# Patient Record
Sex: Female | Born: 1970 | Race: White | Hispanic: No | Marital: Married | Smoking: Never smoker
Health system: Southern US, Community
[De-identification: ages and names within clinical notes are randomized; demographics above are authoritative.]

## PROBLEM LIST (undated history)

## (undated) DIAGNOSIS — M722 Plantar fascial fibromatosis: Secondary | ICD-10-CM

## (undated) DIAGNOSIS — J45909 Unspecified asthma, uncomplicated: Secondary | ICD-10-CM

## (undated) HISTORY — PX: NOSE SURGERY: SHX723

## (undated) HISTORY — PX: TONSILLECTOMY: SUR1361

---

## 2005-03-28 ENCOUNTER — Ambulatory Visit (HOSPITAL_COMMUNITY): Admission: RE | Admit: 2005-03-28 | Discharge: 2005-03-28 | Payer: Self-pay | Admitting: Gynecology

## 2005-05-20 ENCOUNTER — Ambulatory Visit: Payer: Self-pay | Admitting: Family Medicine

## 2005-05-30 ENCOUNTER — Ambulatory Visit: Payer: Self-pay | Admitting: Gynecology

## 2005-06-09 ENCOUNTER — Ambulatory Visit: Payer: Self-pay | Admitting: Gynecology

## 2005-06-12 ENCOUNTER — Ambulatory Visit: Payer: Self-pay | Admitting: Family Medicine

## 2005-06-17 ENCOUNTER — Encounter: Admission: RE | Admit: 2005-06-17 | Discharge: 2005-06-17 | Payer: Self-pay | Admitting: Family Medicine

## 2005-06-18 ENCOUNTER — Ambulatory Visit (HOSPITAL_COMMUNITY): Admission: RE | Admit: 2005-06-18 | Discharge: 2005-06-18 | Payer: Self-pay | Admitting: Gynecology

## 2005-06-23 ENCOUNTER — Ambulatory Visit: Payer: Self-pay | Admitting: Gynecology

## 2005-07-01 ENCOUNTER — Ambulatory Visit: Payer: Self-pay | Admitting: Family Medicine

## 2005-07-03 ENCOUNTER — Inpatient Hospital Stay (HOSPITAL_COMMUNITY): Admission: AD | Admit: 2005-07-03 | Discharge: 2005-07-03 | Payer: Self-pay | Admitting: Family Medicine

## 2005-07-03 ENCOUNTER — Ambulatory Visit: Payer: Self-pay | Admitting: Certified Nurse Midwife

## 2005-07-08 ENCOUNTER — Inpatient Hospital Stay (HOSPITAL_COMMUNITY): Admission: AD | Admit: 2005-07-08 | Discharge: 2005-07-08 | Payer: Self-pay | Admitting: Obstetrics and Gynecology

## 2005-07-10 ENCOUNTER — Ambulatory Visit: Payer: Self-pay | Admitting: Obstetrics & Gynecology

## 2005-07-14 ENCOUNTER — Ambulatory Visit: Payer: Self-pay | Admitting: Gynecology

## 2005-07-17 ENCOUNTER — Ambulatory Visit: Payer: Self-pay | Admitting: Obstetrics & Gynecology

## 2005-07-22 ENCOUNTER — Ambulatory Visit: Payer: Self-pay | Admitting: Family Medicine

## 2005-07-28 ENCOUNTER — Ambulatory Visit: Payer: Self-pay | Admitting: Gynecology

## 2005-08-01 ENCOUNTER — Ambulatory Visit: Payer: Self-pay | Admitting: Gynecology

## 2005-08-05 ENCOUNTER — Ambulatory Visit: Payer: Self-pay | Admitting: Family Medicine

## 2005-08-06 ENCOUNTER — Ambulatory Visit (HOSPITAL_COMMUNITY): Admission: RE | Admit: 2005-08-06 | Discharge: 2005-08-06 | Payer: Self-pay | Admitting: Gynecology

## 2005-08-08 ENCOUNTER — Inpatient Hospital Stay (HOSPITAL_COMMUNITY): Admission: RE | Admit: 2005-08-08 | Discharge: 2005-08-11 | Payer: Self-pay | Admitting: Obstetrics & Gynecology

## 2005-08-08 ENCOUNTER — Ambulatory Visit: Payer: Self-pay | Admitting: Obstetrics & Gynecology

## 2005-08-15 ENCOUNTER — Ambulatory Visit: Payer: Self-pay | Admitting: Gynecology

## 2005-10-09 ENCOUNTER — Ambulatory Visit: Payer: Self-pay | Admitting: Family Medicine

## 2007-01-22 ENCOUNTER — Emergency Department: Payer: Self-pay | Admitting: Emergency Medicine

## 2007-01-22 ENCOUNTER — Other Ambulatory Visit: Payer: Self-pay

## 2015-09-28 ENCOUNTER — Encounter: Payer: Self-pay | Admitting: Gynecology

## 2015-09-28 ENCOUNTER — Ambulatory Visit
Admission: EM | Admit: 2015-09-28 | Discharge: 2015-09-28 | Disposition: A | Discharge status: Home / Self Care | Payer: Medicaid Other | Attending: Family Medicine | Admitting: Family Medicine

## 2015-09-28 DIAGNOSIS — R0982 Postnasal drip: Secondary | ICD-10-CM

## 2015-09-28 DIAGNOSIS — J4520 Mild intermittent asthma, uncomplicated: Secondary | ICD-10-CM

## 2015-09-28 DIAGNOSIS — K219 Gastro-esophageal reflux disease without esophagitis: Secondary | ICD-10-CM

## 2015-09-28 DIAGNOSIS — J329 Chronic sinusitis, unspecified: Secondary | ICD-10-CM | POA: Diagnosis not present

## 2015-09-28 HISTORY — DX: Unspecified asthma, uncomplicated: J45.909

## 2015-09-28 HISTORY — DX: Plantar fascial fibromatosis: M72.2

## 2015-09-28 MED ORDER — FLUTICASONE PROPIONATE 50 MCG/ACT NA SUSP: 2.0000 | Freq: Every day | NASAL | Status: DC

## 2015-09-28 MED ORDER — ALBUTEROL SULFATE HFA 108 (90 BASE) MCG/ACT IN AERS: 1.0000 | INHALATION_SPRAY | Freq: Four times a day (QID) | RESPIRATORY_TRACT | Status: DC | PRN

## 2015-09-28 MED ORDER — ESOMEPRAZOLE MAGNESIUM 40 MG PO CPDR: 40.0000 mg | DELAYED_RELEASE_CAPSULE | Freq: Every day | ORAL | Status: DC

## 2015-09-28 NOTE — Discharge Instructions (Signed)
Asthma, Adult °Asthma is a recurring condition in which the airways tighten and narrow. Asthma can make it difficult to breathe. It can cause coughing, wheezing, and shortness of breath. Asthma episodes, also called asthma attacks, range from minor to life-threatening. Asthma cannot be cured, but medicines and lifestyle changes can help control it. °CAUSES °Asthma is believed to be caused by inherited (genetic) and environmental factors, but its exact cause is unknown. Asthma may be triggered by allergens, lung infections, or irritants in the air. Asthma triggers are different for each person. Common triggers include:  °· Animal dander. °· Dust mites. °· Cockroaches. °· Pollen from trees or grass. °· Mold. °· Smoke. °· Air pollutants such as dust, household cleaners, hair sprays, aerosol sprays, paint fumes, strong chemicals, or strong odors. °· Cold air, weather changes, and winds (which increase molds and pollens in the air). °· Strong emotional expressions such as crying or laughing hard. °· Stress. °· Certain medicines (such as aspirin) or types of drugs (such as beta-blockers). °· Sulfites in foods and drinks. Foods and drinks that may contain sulfites include dried fruit, potato chips, and sparkling grape juice. °· Infections or inflammatory conditions such as the flu, a cold, or an inflammation of the nasal membranes (rhinitis). °· Gastroesophageal reflux disease (GERD). °· Exercise or strenuous activity. °SYMPTOMS °Symptoms may occur immediately after asthma is triggered or many hours later. Symptoms include: °· Wheezing. °· Excessive nighttime or early morning coughing. °· Frequent or severe coughing with a common cold. °· Chest tightness. °· Shortness of breath. °DIAGNOSIS  °The diagnosis of asthma is made by a review of your medical history and a physical exam. Tests may also be performed. These may include: °· Lung function studies. These tests show how much air you breathe in and out. °· Allergy  tests. °· Imaging tests such as X-rays. °TREATMENT  °Asthma cannot be cured, but it can usually be controlled. Treatment involves identifying and avoiding your asthma triggers. It also involves medicines. There are 2 classes of medicine used for asthma treatment:  °· Controller medicines. These prevent asthma symptoms from occurring. They are usually taken every day. °· Reliever or rescue medicines. These quickly relieve asthma symptoms. They are used as needed and provide short-term relief. °Your health care provider will help you create an asthma action plan. An asthma action plan is a written plan for managing and treating your asthma attacks. It includes a list of your asthma triggers and how they may be avoided. It also includes information on when medicines should be taken and when their dosage should be changed. An action plan may also involve the use of a device called a peak flow meter. A peak flow meter measures how well the lungs are working. It helps you monitor your condition. °HOME CARE INSTRUCTIONS  °· Take medicines only as directed by your health care provider. Speak with your health care provider if you have questions about how or when to take the medicines. °· Use a peak flow meter as directed by your health care provider. Record and keep track of readings. °· Understand and use the action plan to help minimize or stop an asthma attack without needing to seek medical care. °· Control your home environment in the following ways to help prevent asthma attacks: °· Do not smoke. Avoid being exposed to secondhand smoke. °· Change your heating and air conditioning filter regularly. °· Limit your use of fireplaces and wood stoves. °· Get rid of pests (such as roaches   and mice) and their droppings.  Throw away plants if you see mold on them.  Clean your floors and dust regularly. Use unscented cleaning products.  Try to have someone else vacuum for you regularly. Stay out of rooms while they are  being vacuumed and for a short while afterward. If you vacuum, use a dust mask from a hardware store, a double-layered or microfilter vacuum cleaner bag, or a vacuum cleaner with a HEPA filter.  Replace carpet with wood, tile, or vinyl flooring. Carpet can trap dander and dust.  Use allergy-proof pillows, mattress covers, and box spring covers.  Wash bed sheets and blankets every week in hot water and dry them in a dryer.  Use blankets that are made of polyester or cotton.  Clean bathrooms and kitchens with bleach. If possible, have someone repaint the walls in these rooms with mold-resistant paint. Keep out of the rooms that are being cleaned and painted.  Wash hands frequently. SEEK MEDICAL CARE IF:   You have wheezing, shortness of breath, or a cough even if taking medicine to prevent attacks.  The colored mucus you cough up (sputum) is thicker than usual.  Your sputum changes from clear or white to yellow, green, gray, or bloody.  You have any problems that may be related to the medicines you are taking (such as a rash, itching, swelling, or trouble breathing).  You are using a reliever medicine more than 2-3 times per week.  Your peak flow is still at 50-79% of your personal best after following your action plan for 1 hour.  You have a fever. SEEK IMMEDIATE MEDICAL CARE IF:   You seem to be getting worse and are unresponsive to treatment during an asthma attack.  You are short of breath even at rest.  You get short of breath when doing very little physical activity.  You have difficulty eating, drinking, or talking due to asthma symptoms.  You develop chest pain.  You develop a fast heartbeat.  You have a bluish color to your lips or fingernails.  You are light-headed, dizzy, or faint.  Your peak flow is less than 50% of your personal best.   This information is not intended to replace advice given to you by your health care provider. Make sure you discuss any  questions you have with your health care provider.   Document Released: 02/24/2005 Document Revised: 11/15/2014 Document Reviewed: 09/23/2012 Elsevier Interactive Patient Education 2016 [employer] Gastroesophageal Reflux Disease, Adult Normally, food travels down the esophagus and stays in the stomach to be digested. However, when a person has gastroesophageal reflux disease (GERD), food and stomach acid move back up into the esophagus. When this happens, the esophagus becomes sore and inflamed. Over time, GERD can create small holes (ulcers) in the lining of the esophagus.  CAUSES This condition is caused by a problem with the muscle between the esophagus and the stomach (lower esophageal sphincter, or LES). Normally, the LES muscle closes after food passes through the esophagus to the stomach. When the LES is weakened or abnormal, it does not close properly, and that allows food and stomach acid to go back up into the esophagus. The LES can be weakened by certain dietary substances, medicines, and medical conditions, including:  Tobacco use.  Pregnancy.  Having a hiatal hernia.  Heavy alcohol use.  Certain foods and beverages, such as coffee, chocolate, onions, and peppermint. RISK FACTORS This condition is more likely to develop in:  People who have an increased  body weight.  People who have connective tissue disorders.  People who use NSAID medicines. SYMPTOMS Symptoms of this condition include:  Heartburn.  Difficult or painful swallowing.  The feeling of having a lump in the throat.  Abitter taste in the mouth.  Bad breath.  Having a large amount of saliva.  Having an upset or bloated stomach.  Belching.  Chest pain.  Shortness of breath or wheezing.  Ongoing (chronic) cough or a night-time cough.  Wearing away of tooth enamel.  Weight loss. Different conditions can cause chest pain. Make sure to see your health care provider if you experience chest  pain. DIAGNOSIS Your health care provider will take a medical history and perform a physical exam. To determine if you have mild or severe GERD, your health care provider may also monitor how you respond to treatment. You may also have other tests, including:  An endoscopy toexamine your stomach and esophagus with a small camera.  A test thatmeasures the acidity level in your esophagus.  A test thatmeasures how much pressure is on your esophagus.  A barium swallow or modified barium swallow to show the shape, size, and functioning of your esophagus. TREATMENT The goal of treatment is to help relieve your symptoms and to prevent complications. Treatment for this condition may vary depending on how severe your symptoms are. Your health care provider may recommend:  Changes to your diet.  Medicine.  Surgery. HOME CARE INSTRUCTIONS Diet  Follow a diet as recommended by your health care provider. This may involve avoiding foods and drinks such as:  Coffee and tea (with or without caffeine).  Drinks that containalcohol.  Energy drinks and sports drinks.  Carbonated drinks or sodas.  Chocolate and cocoa.  Peppermint and mint flavorings.  Garlic and onions.  Horseradish.  Spicy and acidic foods, including peppers, chili powder, curry powder, vinegar, hot sauces, and barbecue sauce.  Citrus fruit juices and citrus fruits, such as oranges, lemons, and limes.  Tomato-based foods, such as red sauce, chili, salsa, and pizza with red sauce.  Fried and fatty foods, such as donuts, french fries, potato chips, and high-fat dressings.  High-fat meats, such as hot dogs and fatty cuts of red and white meats, such as rib eye steak, sausage, ham, and bacon.  High-fat dairy items, such as whole milk, butter, and cream cheese.  Eat small, frequent meals instead of large meals.  Avoid drinking large amounts of liquid with your meals.  Avoid eating meals during the 2-3 hours before  bedtime.  Avoid lying down right after you eat.  Do not exercise right after you eat. General Instructions  Pay attention to any changes in your symptoms.  Take over-the-counter and prescription medicines only as told by your health care provider. Do not take aspirin, ibuprofen, or other NSAIDs unless your health care provider told you to do so.  Do not use any tobacco products, including cigarettes, chewing tobacco, and e-cigarettes. If you need help quitting, ask your health care provider.  Wear loose-fitting clothing. Do not wear anything tight around your waist that causes pressure on your abdomen.  Raise (elevate) the head of your bed 6 inches (15cm).  Try to reduce your stress, such as with yoga or meditation. If you need help reducing stress, ask your health care provider.  If you are overweight, reduce your weight to an amount that is healthy for you. Ask your health care provider for guidance about a safe weight loss goal.  Keep all follow-up  visits as told by your health care provider. This is important. SEEK MEDICAL CARE IF:  You have new symptoms.  You have unexplained weight loss.  You have difficulty swallowing, or it hurts to swallow.  You have wheezing or a persistent cough.  Your symptoms do not improve with treatment.  You have a hoarse voice. SEEK IMMEDIATE MEDICAL CARE IF:  You have pain in your arms, neck, jaw, teeth, or back.  You feel sweaty, dizzy, or light-headed.  You have chest pain or shortness of breath.  You vomit and your vomit looks like blood or coffee grounds.  You faint.  Your stool is bloody or black.  You cannot swallow, drink, or eat.   This information is not intended to replace advice given to you by your health care provider. Make sure you discuss any questions you have with your health care provider.   Document Released: 12/04/2004 Document Revised: 11/15/2014 Document Reviewed: 06/21/2014 Elsevier Interactive Patient  Education [employer]2016 [employer] Inc[employer]

## 2015-09-28 NOTE — ED Notes (Signed)
Triage patient in waiting area. Sp02 98%. Per patient believe her trouble breathing from s/p asthma. Patient stated never follow up on her asthma dx and does not have any refill on her medication.

## 2015-09-28 NOTE — ED Provider Notes (Signed)
CSN: [id number]     Arrival date & time 09/28/15  1834 History   First MD Initiated Contact with Patient 09/28/15 1946     Chief Complaint  Patient presents with  . Throat tightness    (Consider location/radiation/quality/duration/timing/severity/associated sxs/prior Treatment) Patient is a 45 y.o. female presenting with URI. The history is provided by the patient.  URI Presenting symptoms: cough (mild, non-productive)   Severity:  Moderate Onset quality:  Sudden Duration:  1 week Timing:  Constant Progression:  Unchanged Chronicity:  New Relieved by:  Nothing Ineffective treatments:  OTC medications Associated symptoms: wheezing   Risk factors: chronic respiratory disease (mild, intermittent asthma)   Risk factors: not elderly, no chronic cardiac disease, no chronic kidney disease, no immunosuppression, no recent illness and no recent travel     Past Medical History  Diagnosis Date  . Asthma   . Plantar fasciitis    Past Surgical History  Procedure Laterality Date  . Nose surgery    . Cesarean section    . Tonsillectomy     No family history on file. Social History  Substance Use Topics  . Smoking status: Never Smoker   . Smokeless tobacco: None  . Alcohol Use: No   OB History    No data available     Review of Systems  Respiratory: Positive for cough (mild, non-productive) and wheezing.     Allergies  Review of patient's allergies indicates no known allergies.  Home Medications   Prior to Admission medications   Medication Sig Start Date End Date Taking? Authorizing Provider  prednisoLONE 5 MG TABS tablet Take by mouth.   Yes Historical Provider, MD  albuterol (PROVENTIL HFA;VENTOLIN HFA) 108 (90 Base) MCG/ACT inhaler Inhale 1-2 puffs into the lungs every 6 (six) hours as needed for wheezing or shortness of breath. 09/28/15   Payton Mccallumrlando Teondra Newburg, MD  esomeprazole (NEXIUM) 40 MG capsule Take 1 capsule (40 mg total) by mouth daily at 12 noon. 09/28/15   Payton Mccallumrlando  Jaysen Wey, MD  fluticasone (FLONASE) 50 MCG/ACT nasal spray Place 2 sprays into both nostrils daily. 09/28/15   Payton Mccallumrlando Hartley Wyke, MD   Meds Ordered and Administered this Visit  Medications - No data to display  BP 99/60 mmHg  Pulse 74  Temp(Src) 98.7 F (37.1 C) (Oral)  Resp 16  Ht 5\' 3"  (1.6 m)  Wt 155 lb (70.308 kg)  BMI 27.46 kg/m2  SpO2 99%  LMP 09/07/2015 No data found.   Physical Exam  Constitutional: She appears well-developed and well-nourished. No distress.  HENT:  Head: Normocephalic and atraumatic.  Right Ear: Tympanic membrane, external ear and ear canal normal.  Left Ear: Tympanic membrane, external ear and ear canal normal.  Nose: No mucosal edema, rhinorrhea, nose lacerations, sinus tenderness, nasal deformity, septal deviation or nasal septal hematoma. No epistaxis.  No foreign bodies.  Mouth/Throat: Uvula is midline, oropharynx is clear and moist and mucous membranes are normal. No oropharyngeal exudate.  Eyes: Conjunctivae and EOM are normal. Pupils are equal, round, and reactive to light. Right eye exhibits no discharge. Left eye exhibits no discharge. No scleral icterus.  Neck: Normal range of motion. Neck supple. No thyromegaly present.  Cardiovascular: Normal rate, regular rhythm and normal heart sounds.   Pulmonary/Chest: Effort normal and breath sounds normal. No respiratory distress. She has no wheezes. She has no rales.  Lymphadenopathy:    She has no cervical adenopathy.  Skin: She is not diaphoretic.  Nursing note and vitals reviewed.   ED Course  Procedures (including critical care time)  Labs Review Labs Reviewed - No data to display  Imaging Review No results found.   Visual Acuity Review  Right Eye Distance:   Left Eye Distance:   Bilateral Distance:    Right Eye Near:   Left Eye Near:    Bilateral Near:         MDM   1. Asthma, mild intermittent, uncomplicated   2. Gastroesophageal reflux disease, esophagitis presence not  specified   3. Post-nasal drainage    Discharge Medication List as of 09/28/2015  8:01 PM    START taking these medications   Details  albuterol (PROVENTIL HFA;VENTOLIN HFA) 108 (90 Base) MCG/ACT inhaler Inhale 1-2 puffs into the lungs every 6 (six) hours as needed for wheezing or shortness of breath., Starting 09/28/2015, Until Discontinued, Normal    esomeprazole (NEXIUM) 40 MG capsule Take 1 capsule (40 mg total) by mouth daily at 12 noon., Starting 09/28/2015, Until Discontinued, Normal    fluticasone (FLONASE) 50 MCG/ACT nasal spray Place 2 sprays into both nostrils daily., Starting 09/28/2015, Until Discontinued, Normal       1.  diagnosis reviewed with patient/parent/guardian/family 2. rx as per orders above; reviewed possible side effects, interactions, risks and benefits  3.. Follow-up prn if symptoms worsen or don't improve    Payton Mccallum, MD 09/28/15 2020

## 2015-11-30 ENCOUNTER — Ambulatory Visit
Admission: EM | Admit: 2015-11-30 | Discharge: 2015-11-30 | Disposition: A | Discharge status: Home / Self Care | Payer: 59 | Attending: Family Medicine | Admitting: Family Medicine

## 2015-11-30 DIAGNOSIS — J0101 Acute recurrent maxillary sinusitis: Secondary | ICD-10-CM | POA: Diagnosis not present

## 2015-11-30 DIAGNOSIS — J302 Other seasonal allergic rhinitis: Secondary | ICD-10-CM

## 2015-11-30 DIAGNOSIS — J0111 Acute recurrent frontal sinusitis: Secondary | ICD-10-CM | POA: Diagnosis not present

## 2015-11-30 DIAGNOSIS — H6504 Acute serous otitis media, recurrent, right ear: Secondary | ICD-10-CM

## 2015-11-30 DIAGNOSIS — H6592 Unspecified nonsuppurative otitis media, left ear: Secondary | ICD-10-CM | POA: Diagnosis not present

## 2015-11-30 MED ORDER — AMOXICILLIN-POT CLAVULANATE 875-125 MG PO TABS: 1.0000 | ORAL_TABLET | Freq: Two times a day (BID) | ORAL | 0 refills | Status: DC

## 2015-11-30 MED ORDER — FLUTICASONE PROPIONATE 50 MCG/ACT NA SUSP: 2.0000 | Freq: Every day | NASAL | 0 refills | Status: DC

## 2015-11-30 MED ORDER — SALINE SPRAY 0.65 % NA SOLN
2.0000 | NASAL | 0 refills | Status: DC
Start: 2015-11-30 — End: 2016-04-29

## 2015-11-30 MED ORDER — IBUPROFEN 800 MG PO TABS: 800.0000 mg | ORAL_TABLET | Freq: Three times a day (TID) | ORAL | 0 refills | Status: DC

## 2015-11-30 MED ORDER — CETIRIZINE HCL 10 MG PO CAPS: 10.0000 mg | ORAL_CAPSULE | Freq: Every day | ORAL | 0 refills | Status: DC

## 2015-11-30 NOTE — ED Provider Notes (Signed)
CSN: [id number]     Arrival date & time 11/30/15  1811 History   First MD Initiated Contact with Patient 11/30/15 1855     Chief Complaint  Patient presents with  . Recurrent Sinusitis   (Consider location/radiation/quality/duration/timing/severity/associated sxs/prior Treatment) Married caucasian female here for evaluation sinus congestion, ear pressure/muffled hearing, headache.  Last treated with prednisone and flonase/antihistamine for last sinus infection.  Stopped her nasal sprays and antihistamines.  Typical to have fall flare.  Two sons with sore throat/rhinitis here with her for evaluation today also.  Tried motrin, rest not helping sinus pressure worsening.      Past Medical History:  Diagnosis Date  . Asthma   . Plantar fasciitis    Past Surgical History:  Procedure Laterality Date  . CESAREAN SECTION    . NOSE SURGERY    . TONSILLECTOMY     History reviewed. No pertinent family history. Social History  Substance Use Topics  . Smoking status: Never Smoker  . Smokeless tobacco: Never Used  . Alcohol use No   OB History    No data available     Review of Systems  Constitutional: Negative for activity change, appetite change, chills, diaphoresis, fatigue, fever and unexpected weight change.  HENT: Positive for congestion, ear pain, postnasal drip, rhinorrhea, sinus pressure and sore throat. Negative for dental problem, drooling, ear discharge, facial swelling, hearing loss, mouth sores, nosebleeds, sneezing, tinnitus, trouble swallowing and voice change.   Eyes: Negative for photophobia, pain, discharge, redness, itching and visual disturbance.  Respiratory: Negative for cough, choking, chest tightness, shortness of breath, wheezing and stridor.   Cardiovascular: Negative for chest pain, palpitations and leg swelling.  Gastrointestinal: Negative for abdominal distention, abdominal pain, blood in stool, constipation, diarrhea, nausea and vomiting.  Endocrine:  Negative for cold intolerance and heat intolerance.  Genitourinary: Negative for difficulty urinating, dysuria and hematuria.  Musculoskeletal: Negative for arthralgias, back pain, gait problem, joint swelling, myalgias, neck pain and neck stiffness.  Skin: Negative for color change, pallor, rash and wound.  Allergic/Immunologic: Positive for environmental allergies. Negative for food allergies.  Neurological: Positive for headaches. Negative for dizziness, tremors, seizures, syncope, facial asymmetry, speech difficulty, weakness, light-headedness and numbness.  Hematological: Negative for adenopathy. Does not bruise/bleed easily.  Psychiatric/Behavioral: Negative for agitation, behavioral problems, confusion and sleep disturbance.    Allergies  Review of patient's allergies indicates no known allergies.  Home Medications   Prior to Admission medications   Medication Sig Start Date End Date Taking? Authorizing Provider  albuterol (PROVENTIL HFA;VENTOLIN HFA) 108 (90 Base) MCG/ACT inhaler Inhale 1-2 puffs into the lungs every 6 (six) hours as needed for wheezing or shortness of breath. 09/28/15   Payton Mccallum, MD  amoxicillin-clavulanate (AUGMENTIN) 875-125 MG tablet Take 1 tablet by mouth every 12 (twelve) hours. 11/30/15   Barbaraann Barthel, NP  Cetirizine HCl 10 MG CAPS Take 1 capsule (10 mg total) by mouth daily. 11/30/15 12/30/15  Barbaraann Barthel, NP  fluticasone (FLONASE) 50 MCG/ACT nasal spray Place 2 sprays into both nostrils daily. 11/30/15 12/30/15  Barbaraann Barthel, NP  ibuprofen (ADVIL,MOTRIN) 800 MG tablet Take 1 tablet (800 mg total) by mouth 3 (three) times daily. 11/30/15   Barbaraann Barthel, NP  sodium chloride (OCEAN) 0.65 % SOLN nasal spray Place 2 sprays into both nostrils every 2 (two) hours while awake. 11/30/15 12/30/15  Barbaraann Barthel, NP   Meds Ordered and Administered this Visit  Medications - No data to display  BP 124/73 (  BP Location: Left Arm)   Pulse 89    Temp 97.4 F (36.3 C) (Tympanic)   Resp 18   Ht 5\' 2"  (1.575 m)   Wt 160 lb (72.6 kg)   LMP 11/30/2015   SpO2 99%   BMI 29.26 kg/m  No data found.   Physical Exam  Constitutional: She is oriented to person, place, and time. Vital signs are normal. She appears well-developed and well-nourished. She is active and cooperative.  Non-toxic appearance. She does not have a sickly appearance. She appears ill. No distress.  HENT:  Head: Normocephalic and atraumatic.  Right Ear: Hearing, external ear and ear canal normal. A middle ear effusion is present.  Left Ear: Hearing, external ear and ear canal normal. A middle ear effusion is present.  Nose: Mucosal edema and rhinorrhea present. No nose lacerations, sinus tenderness, nasal deformity, septal deviation or nasal septal hematoma. No epistaxis.  No foreign bodies. Right sinus exhibits maxillary sinus tenderness and frontal sinus tenderness. Left sinus exhibits maxillary sinus tenderness and frontal sinus tenderness.  Mouth/Throat: Uvula is midline and mucous membranes are normal. Mucous membranes are not pale, not dry and not cyanotic. She does not have dentures. No oral lesions. No trismus in the jaw. Normal dentition. No dental abscesses, uvula swelling, lacerations or dental caries. Posterior oropharyngeal edema and posterior oropharyngeal erythema present. No oropharyngeal exudate or tonsillar abscesses. Tonsils are 1+ on the right. Tonsils are 1+ on the left. No tonsillar exudate.  Cobblestoning posterior pharynx; bilateral TMs with air fluid level clear left opacity right and external auditory canal right with macular erythema; bilateral nasal turbinates edema/erythema clear discharge; bilateral allergic shiners  Eyes: Conjunctivae, EOM and lids are normal. Pupils are equal, round, and reactive to light. Right eye exhibits no chemosis, no discharge, no exudate and no hordeolum. No foreign body present in the right eye. Left eye exhibits no  chemosis, no discharge, no exudate and no hordeolum. No foreign body present in the left eye. Right conjunctiva is not injected. Right conjunctiva has no hemorrhage. Left conjunctiva is not injected. Left conjunctiva has no hemorrhage. No scleral icterus. Right eye exhibits normal extraocular motion and no nystagmus. Left eye exhibits normal extraocular motion and no nystagmus. Right pupil is round and reactive. Left pupil is round and reactive. Pupils are equal.  Neck: Trachea normal, normal range of motion and phonation normal. Neck supple. No tracheal tenderness, no spinous process tenderness and no muscular tenderness present. No neck rigidity. No tracheal deviation, no edema, no erythema and normal range of motion present. No thyroid mass and no thyromegaly present.  Cardiovascular: Normal rate, regular rhythm, S1 normal, S2 normal, normal heart sounds and intact distal pulses.  PMI is not displaced.  Exam reveals no gallop and no friction rub.   No murmur heard. Pulmonary/Chest: Effort normal and breath sounds normal. No accessory muscle usage or stridor. No respiratory distress. She has no decreased breath sounds. She has no wheezes. She has no rhonchi. She has no rales. She exhibits no tenderness.  Abdominal: Soft. Normal appearance. She exhibits no distension.  Musculoskeletal: Normal range of motion. She exhibits no edema, tenderness or deformity.       Right shoulder: Normal.       Left shoulder: Normal.       Right hip: Normal.       Left hip: Normal.       Right knee: Normal.       Left knee: Normal.  Cervical back: Normal.       Right hand: Normal.       Left hand: Normal.  Lymphadenopathy:       Head (right side): No submental, no submandibular, no tonsillar, no preauricular, no posterior auricular and no occipital adenopathy present.       Head (left side): No submental, no submandibular, no tonsillar, no preauricular, no posterior auricular and no occipital adenopathy present.     She has no cervical adenopathy.       Right cervical: No superficial cervical, no deep cervical and no posterior cervical adenopathy present.      Left cervical: No superficial cervical, no deep cervical and no posterior cervical adenopathy present.  Neurological: She is alert and oriented to person, place, and time. She has normal strength. She is not disoriented. She displays no atrophy and no tremor. No cranial nerve deficit or sensory deficit. She exhibits normal muscle tone. She displays no seizure activity. Coordination and gait normal. GCS eye subscore is 4. GCS verbal subscore is 5. GCS motor subscore is 6.  Skin: Skin is warm, dry and intact. Capillary refill takes less than 2 seconds. No abrasion, no bruising, no burn, no ecchymosis, no laceration, no lesion, no petechiae and no rash noted. She is not diaphoretic. No cyanosis or erythema. No pallor. Nails show no clubbing.  Psychiatric: She has a normal mood and affect. Her speech is normal and behavior is normal. Judgment and thought content normal. She is not actively hallucinating. Cognition and memory are normal. She is attentive.  Nursing note and vitals reviewed.   Urgent Care Course   Clinical Course    Procedures (including critical care time)  Labs Review Labs Reviewed - No data to display  Imaging Review No results found.     MDM   1. Acute recurrent frontal sinusitis   2. Acute recurrent maxillary sinusitis   3. Recurrent acute serous otitis media of right ear   4. Otitis media with effusion, left   5. Seasonal allergic rhinitis    Patient may use normal saline nasal spray as needed.  Consider antihistamine (claritin or zyrtec 10mg  po daily) and restart flonase 1 spray each nostril BID and nasal saline 2 sprays each nostril q2h prn congestions.  Avoid triggers if possible.  Shower BID am/pm.  If allergic dust/dust mites recommend mattress/pillow covers/encasements; washing linens, vacuuming, sweeping, dusting  weekly.  Call or return to clinic as needed if these symptoms worsen or fail to improve as anticipated.   Exitcare handout on allergic rhinitis given to patient.  Patient verbalized understanding of instructions, agreed with plan of care and had no further questions at this time.  P2:  Avoidance and hand washing.  Supportive treatment.   No evidence of invasive bacterial infection, non toxic and well hydrated.  This is most likely self limiting viral infection.  I do not see where any further testing or imaging is necessary at this time.   I will suggest supportive care, rest, good hygiene and encourage the patient to take adequate fluids.  The patient is to return to clinic or EMERGENCY ROOM if symptoms worsen or change significantly e.g. ear pain, fever, purulent discharge from ears or bleeding.  Exitcare handout on otitis media with effusion given to patient.  Patient verbalized agreement and understanding of treatment plan.     Suspect Viral illness: no evidence of invasive bacterial infection, non toxic and well hydrated.  This is most likely self limiting viral infection.  I do not see where any further testing or imaging is necessary at this time.   I will suggest supportive care, rest, good hygiene and encourage the patient to take adequate fluids.  Does not require work excuse.  Notified patient staff will call with culture results once available next 48+ hours.  Sudafed 30mg  po q4-6h prn max 240mg  per 24 hours; flonase 1 spray each nostril BID prn, nasal saline 1-2 sprays each nostril prn q2h, motrin 800mg  po TID prn.  Discussed honey with lemon and salt water gargles for comfort also.  The patient is to return to clinic or EMERGENCY ROOM if symptoms worsen or change significantly e.g. fever, lethargy, SOB, wheezing.  Exitcare handout on viral illness given to patient.  Patient verbalized agreement and understanding of treatment plan.    Restart flonase 1 spray each nostril BID, saline 2 sprays each  nostril q2h prn congestion.  If no improvement with 48 hours of saline and flonase use start augmentin 875mg  po BID x 10 days.  Rx given.  No evidence of systemic bacterial infection, non toxic and well hydrated.  I do not see where any further testing or imaging is necessary at this time.   I will suggest supportive care, rest, good hygiene and encourage the patient to take adequate fluids.  The patient is to return to clinic or EMERGENCY ROOM if symptoms worsen or change significantly.  Exitcare handout on sinusitis given to patient.  Patient verbalized agreement and understanding of treatment plan and had no further questions at this time.   P2:  Hand washing and cover cough   Usually no specific medical treatment is needed if a virus is causing the sore throat.  The throat most often gets better on its own within 5 to 7 days.  Antibiotic medicine does not cure viral pharyngitis.   For acute pharyngitis caused by bacteria, your healthcare provider will prescribe an antibiotic.  Marland Kitchen Do not smoke.  Marland Kitchen Avoid secondhand smoke and other air pollutants.  . Use a cool mist humidifier to add moisture to the air.  . Get plenty of rest.  . You may want to rest your throat by talking less and eating a diet that is mostly liquid or soft for a day or two.   Marland Kitchen Nonprescription throat lozenges and mouthwashes should help relieve the soreness.   . Gargling with warm saltwater and drinking warm liquids may help.  (You can make a saltwater solution by adding 1/4 teaspoon of salt to 8 ounces, or 240 mL, of warm water.)  . A nonprescription pain reliever such as aspirin, acetaminophen, or ibuprofen may ease general aches and pains.   FOLLOW UP with clinic provider if no improvements in the next 7-10 days.  Patient verbalized understanding of instructions and agreed with plan of care. P2:  Hand washing and diet.       Barbaraann Barthel, NP 11/30/15 2018

## 2015-11-30 NOTE — ED Triage Notes (Signed)
Mom says she has had a headache today and lots of pressure in her face. She has had several sinus infection in the past.

## 2016-04-29 ENCOUNTER — Ambulatory Visit
Admission: EM | Admit: 2016-04-29 | Discharge: 2016-04-29 | Disposition: A | Discharge status: Home / Self Care | Payer: 59 | Attending: Family Medicine | Admitting: Family Medicine

## 2016-04-29 DIAGNOSIS — H6980 Other specified disorders of Eustachian tube, unspecified ear: Secondary | ICD-10-CM

## 2016-04-29 DIAGNOSIS — Z9109 Other allergy status, other than to drugs and biological substances: Secondary | ICD-10-CM

## 2016-04-29 LAB — RAPID STREP SCREEN (MED CTR MEBANE ONLY): Streptococcus, Group A Screen (Direct): NEGATIVE

## 2016-04-29 NOTE — Discharge Instructions (Signed)
Recommend over the counter allergy medication and decongestant

## 2016-04-29 NOTE — ED Triage Notes (Signed)
Pt states that she feels like she has blisters in her mouth and it feels tight and swollen. It started to feel this way this morning when she woke up.

## 2016-04-29 NOTE — ED Provider Notes (Signed)
[facility]    CSN: [id number] Arrival date & time: 04/29/16  0807     History   Chief Complaint Chief Complaint  Patient presents with  . Sore Throat    HPI Monique [name] is a 46 y.o. female.   The history is provided by the patient.  URI  Presenting symptoms: congestion, rhinorrhea and sore throat   Severity:  Moderate Onset quality:  Sudden Timing:  Constant Progression:  Unchanged Chronicity:  New Relieved by:  None tried Associated symptoms: sneezing   Associated symptoms: no arthralgias, no headaches, no myalgias, no neck pain, no sinus pain and no wheezing   Risk factors: not elderly, no chronic cardiac disease, no chronic kidney disease, no chronic respiratory disease, no diabetes mellitus, no immunosuppression, no recent illness, no recent travel and no sick contacts     Past Medical History:  Diagnosis Date  . Asthma   . Plantar fasciitis     There are no active problems to display for this patient.   Past Surgical History:  Procedure Laterality Date  . CESAREAN SECTION    . NOSE SURGERY    . TONSILLECTOMY      OB History    No data available       Home Medications    Prior to Admission medications   Medication Sig Start Date End Date Taking? Authorizing Provider  albuterol (PROVENTIL HFA;VENTOLIN HFA) 108 (90 Base) MCG/ACT inhaler Inhale 1-2 puffs into the lungs every 6 (six) hours as needed for wheezing or shortness of breath. 09/28/15  Yes Payton Mccallumrlando Bristol Osentoski, MD  ibuprofen (ADVIL,MOTRIN) 800 MG tablet Take 1 tablet (800 mg total) by mouth 3 (three) times daily. 11/30/15  Yes Barbaraann Barthelina A Betancourt, NP    Family History History reviewed. No pertinent family history.  Social History Social History  Substance Use Topics  . Smoking status: Never Smoker  . Smokeless tobacco: Never Used  . Alcohol use No     Allergies   Patient has no known allergies.   Review of Systems Review of Systems  HENT: Positive for congestion,  rhinorrhea, sneezing and sore throat. Negative for sinus pain.   Respiratory: Negative for wheezing.   Musculoskeletal: Negative for arthralgias, myalgias and neck pain.  Neurological: Negative for headaches.     Physical Exam Triage Vital Signs ED Triage Vitals  Enc Vitals Group     BP 04/29/16 0820 106/68     Pulse Rate 04/29/16 0820 68     Resp 04/29/16 0820 18     Temp 04/29/16 0820 98.6 F (37 C)     Temp Source 04/29/16 0820 Oral     SpO2 04/29/16 0820 99 %     Weight 04/29/16 0821 160 lb (72.6 kg)     Height 04/29/16 0821 5\' 2"  (1.575 m)     Head Circumference --      Peak Flow --      Pain Score 04/29/16 0822 2     Pain Loc --      Pain Edu? --      Excl. in GC? --    No data found.   Updated Vital Signs BP 106/68 (BP Location: Left Arm)   Pulse 68   Temp 98.6 F (37 C) (Oral)   Resp 18   Ht 5\' 2"  (1.575 m)   Wt 160 lb (72.6 kg)   LMP 04/15/2016   SpO2 99%   BMI 29.26 kg/m   Visual Acuity Right Eye Distance:   Left  Eye Distance:   Bilateral Distance:    Right Eye Near:   Left Eye Near:    Bilateral Near:     Physical Exam  Constitutional: She appears well-developed and well-nourished. No distress.  HENT:  Head: Normocephalic and atraumatic.  Right Ear: External ear and ear canal normal. A middle ear effusion is present.  Left Ear: External ear and ear canal normal. A middle ear effusion is present.  Nose: Mucosal edema and rhinorrhea present. No nose lacerations, sinus tenderness, nasal deformity, septal deviation or nasal septal hematoma. No epistaxis.  No foreign bodies. Right sinus exhibits no maxillary sinus tenderness and no frontal sinus tenderness. Left sinus exhibits no maxillary sinus tenderness and no frontal sinus tenderness.  Mouth/Throat: Uvula is midline, oropharynx is clear and moist and mucous membranes are normal. No oropharyngeal exudate.  Eyes: Conjunctivae and EOM are normal. Pupils are equal, round, and reactive to light. Right  eye exhibits no discharge. Left eye exhibits no discharge. No scleral icterus.  Neck: Normal range of motion. Neck supple. No thyromegaly present.  Cardiovascular: Normal rate, regular rhythm and normal heart sounds.   Pulmonary/Chest: Effort normal and breath sounds normal. No respiratory distress. She has no wheezes. She has no rales.  Lymphadenopathy:    She has no cervical adenopathy.  Skin: She is not diaphoretic.  Nursing note and vitals reviewed.    UC Treatments / Results  Labs (all labs ordered are listed, but only abnormal results are displayed) Labs Reviewed  RAPID STREP SCREEN (NOT AT [facility])  CULTURE, GROUP A STREP [facility])    EKG  EKG Interpretation None       Radiology No results found.  Procedures Procedures (including critical care time)  Medications Ordered in UC Medications - No data to display   Initial Impression / Assessment and Plan / UC Course  I have reviewed the triage vital signs and the nursing notes.  Pertinent labs & imaging results that were available during my care of the patient were reviewed by me and considered in my medical decision making (see chart for details).       Final Clinical Impressions(s) / UC Diagnoses   Final diagnoses:  Dysfunction of Eustachian tube, unspecified laterality  Environmental allergies    New Prescriptions Discharge Medication List as of 04/29/2016  9:07 AM     1. Lab result (negative strep) diagnosis reviewed with patient 2. Recommend supportive treatment with otc allergy medication and decongestant 3. Follow-up prn if symptoms worsen or don't improve   Payton Mccallum, MD 04/29/16 1144

## 2016-05-01 LAB — CULTURE, GROUP A STREP (THRC)

## 2017-04-17 ENCOUNTER — Ambulatory Visit
Admission: EM | Admit: 2017-04-17 | Discharge: 2017-04-17 | Disposition: A | Discharge status: Home / Self Care | Payer: Self-pay | Attending: Family Medicine | Admitting: Family Medicine

## 2017-04-17 ENCOUNTER — Ambulatory Visit (INDEPENDENT_AMBULATORY_CARE_PROVIDER_SITE_OTHER): Payer: Self-pay

## 2017-04-17 DIAGNOSIS — M7750 Other enthesopathy of unspecified foot: Secondary | ICD-10-CM

## 2017-04-17 DIAGNOSIS — S9031XA Contusion of right foot, initial encounter: Secondary | ICD-10-CM

## 2017-04-17 DIAGNOSIS — B9789 Other viral agents as the cause of diseases classified elsewhere: Secondary | ICD-10-CM

## 2017-04-17 DIAGNOSIS — M65871 Other synovitis and tenosynovitis, right ankle and foot: Secondary | ICD-10-CM

## 2017-04-17 DIAGNOSIS — J069 Acute upper respiratory infection, unspecified: Secondary | ICD-10-CM

## 2017-04-17 NOTE — ED Provider Notes (Signed)
[facility]    CSN: [id number] Arrival date & time: 04/17/17  1948     History   Chief Complaint Chief Complaint  Patient presents with  . Foot Pain  . URI    HPI Monique [name] is a 47 y.o. female.   Also c/o right foot pain on top of the foot. States she may have dropped something on it, but "not sure" (doesn't remember). Denies any fevers, chills, rash, numbness /tingling.    The history is provided by the patient.  URI  Presenting symptoms: congestion, cough and rhinorrhea   Severity:  Moderate Onset quality:  Sudden Duration:  2 days Timing:  Constant Progression:  Unchanged Chronicity:  New Relieved by:  None tried Ineffective treatments:  None tried Associated symptoms: no sinus pain and no wheezing   Risk factors: sick contacts   Risk factors: not elderly, no chronic cardiac disease, no chronic kidney disease, no chronic respiratory disease, no diabetes mellitus, no immunosuppression, no recent illness and no recent travel     Past Medical History:  Diagnosis Date  . Asthma   . Plantar fasciitis     There are no active problems to display for this patient.   Past Surgical History:  Procedure Laterality Date  . CESAREAN SECTION    . NOSE SURGERY    . TONSILLECTOMY      OB History    No data available       Home Medications    Prior to Admission medications   Medication Sig Start Date End Date Taking? Authorizing Provider  albuterol (PROVENTIL HFA;VENTOLIN HFA) 108 (90 Base) MCG/ACT inhaler Inhale 1-2 puffs into the lungs every 6 (six) hours as needed for wheezing or shortness of breath. 09/28/15   Payton Mccallum, MD  ibuprofen (ADVIL,MOTRIN) 800 MG tablet Take 1 tablet (800 mg total) by mouth 3 (three) times daily. 11/30/15   Betancourt, Jarold Song, NP    Family History No family history on file.  Social History Social History   Tobacco Use  . Smoking status: Never Smoker  . Smokeless tobacco: Never Used  Substance Use  Topics  . Alcohol use: No  . Drug use: No     Allergies   Patient has no known allergies.   Review of Systems Review of Systems  HENT: Positive for congestion and rhinorrhea. Negative for sinus pain.   Respiratory: Positive for cough. Negative for wheezing.      Physical Exam Triage Vital Signs ED Triage Vitals  Enc Vitals Group     BP 04/17/17 2000 108/64     Pulse Rate 04/17/17 2000 87     Resp 04/17/17 2000 18     Temp 04/17/17 2000 98.9 F (37.2 C)     Temp Source 04/17/17 2000 Oral     SpO2 04/17/17 2000 97 %     Weight --      Height --      Head Circumference --      Peak Flow --      Pain Score 04/17/17 2004 5     Pain Loc --      Pain Edu? --      Excl. in [place]? --    No data found.  Updated Vital Signs BP 108/64 (BP Location: Left Arm)   Pulse 87   Temp 98.9 F (37.2 C) (Oral)   Resp 18   LMP 04/03/2017   SpO2 97%   Visual Acuity Right Eye Distance:   Left Eye  Distance:   Bilateral Distance:    Right Eye Near:   Left Eye Near:    Bilateral Near:     Physical Exam  Constitutional: She appears well-developed and well-nourished.  Non-toxic appearance. She does not have a sickly appearance. No distress.  HENT:  Head: Normocephalic and atraumatic.  Right Ear: Tympanic membrane, external ear and ear canal normal.  Left Ear: Tympanic membrane, external ear and ear canal normal.  Nose: Rhinorrhea present. No mucosal edema, nose lacerations, sinus tenderness, nasal deformity, septal deviation or nasal septal hematoma. No epistaxis.  No foreign bodies. Right sinus exhibits no maxillary sinus tenderness and no frontal sinus tenderness. Left sinus exhibits no maxillary sinus tenderness and no frontal sinus tenderness.  Mouth/Throat: Uvula is midline, oropharynx is clear and moist and mucous membranes are normal. No oropharyngeal exudate. No tonsillar exudate.  Eyes: Conjunctivae and EOM are normal. Pupils are equal, round, and reactive to light. Right eye  exhibits no discharge. Left eye exhibits no discharge. No scleral icterus.  Neck: Normal range of motion. Neck supple. No thyromegaly present.  Cardiovascular: Normal rate, regular rhythm and normal heart sounds.  Pulmonary/Chest: Effort normal and breath sounds normal. No stridor. No respiratory distress. She has no wheezes. She has no rales.  Musculoskeletal:       Right foot: There is tenderness and bony tenderness. There is normal range of motion, no swelling, normal capillary refill, no crepitus, no deformity and no laceration.  Lymphadenopathy:    She has no cervical adenopathy.  Skin: She is not diaphoretic.  Nursing note and vitals reviewed.    UC Treatments / Results  Labs (all labs ordered are listed, but only abnormal results are displayed) Labs Reviewed - No data to display  EKG  EKG Interpretation None       Radiology Dg Foot Complete Right  Result Date: 04/17/2017 CLINICAL DATA:  Pain at the top of the right foot EXAM: RIGHT FOOT COMPLETE - 3+ VIEW COMPARISON:  None. FINDINGS: There is no evidence of fracture or dislocation. Small plantar calcaneal spur. Soft tissues are unremarkable. IMPRESSION: Negative. Electronically Signed   By: Jasmine PangKim  Fujinaga M.D.   On: 04/17/2017 20:58    Procedures Procedures (including critical care time)  Medications Ordered in UC Medications - No data to display   Initial Impression / Assessment and Plan / UC Course  I have reviewed the triage vital signs and the nursing notes.  Pertinent labs & imaging results that were available during my care of the patient were reviewed by me and considered in my medical decision making (see chart for details).      Final Clinical Impressions(s) / UC Diagnoses   Final diagnoses:  Viral URI with cough  Contusion of right foot, initial encounter  Tendonitis of foot    ED Discharge Orders    None     1. x-ray results and diagnosis reviewed with patient 2. rx as per orders above;  reviewed possible side effects, interactions, risks and benefits  3. Recommend supportive treatment with rest, ice, elevation of foot; otc cold/cough med prn; increased fluids 4. Follow-up prn if symptoms worsen or don't improve  Controlled Substance Prescriptions [place] Controlled Substance Registry consulted? Not Applicable   Payton Mccallumonty, Zackariah Vanderpol, MD 04/17/17 2112

## 2017-04-17 NOTE — ED Triage Notes (Signed)
Pt states she is having pain on the top of her right foot for the past 3 days and states it has been constant did not injure it but said she might of did it at work but unsure what. Did try naproxen without relief. Also states she has a cold for 2 days, productive cough, green mucus, hard to breathe and loss of voice. No reports of fever, took mucinex this morning.

## 2017-04-20 ENCOUNTER — Telehealth: Payer: Self-pay

## 2017-04-20 NOTE — Telephone Encounter (Signed)
Call[facility] to [facility] with patient [facility]. Patient reports no improvement, encourage follow up. Patient instructed to call back with any questions or concerns. [facility]

## 2017-07-20 ENCOUNTER — Encounter: Payer: Self-pay | Admitting: Emergency Medicine

## 2017-07-20 ENCOUNTER — Ambulatory Visit
Admission: EM | Admit: 2017-07-20 | Discharge: 2017-07-20 | Disposition: A | Discharge status: Home / Self Care | Payer: Non-veteran care | Attending: Family Medicine | Admitting: Family Medicine

## 2017-07-20 ENCOUNTER — Ambulatory Visit (INDEPENDENT_AMBULATORY_CARE_PROVIDER_SITE_OTHER): Payer: Non-veteran care

## 2017-07-20 DIAGNOSIS — R221 Localized swelling, mass and lump, neck: Secondary | ICD-10-CM

## 2017-07-20 DIAGNOSIS — L237 Allergic contact dermatitis due to plants, except food: Secondary | ICD-10-CM | POA: Diagnosis not present

## 2017-07-20 DIAGNOSIS — T7840XA Allergy, unspecified, initial encounter: Secondary | ICD-10-CM

## 2017-07-20 NOTE — ED Triage Notes (Signed)
Patient states she has poison ivy in her throat and seems to be having an allergic reaction to it.

## 2017-07-20 NOTE — Discharge Instructions (Addendum)
Add Zyrtec, Claritin or Allegra once daily Add Zantac twice daily

## 2017-07-20 NOTE — ED Provider Notes (Signed)
[facility]    CSN: [id number] Arrival date & time: 07/20/17  1809     History   Chief Complaint Chief Complaint  Patient presents with  . Allergic Reaction    HPI Monique [name] is a 47 y.o. female.   47 yo female with a c/o sensation of swelling in her throat since last week. States she was weed eating some poison ivy last week prior to symptoms starting and presented to the [facility] Emergency Department and was told she had poison ivy rash in her throat. She was prescribed 3 week taper course of prednisone. States doing better but symptoms not resolved. Denies chest pains or shortness of breath.   The history is provided by the patient.  Allergic Reaction    Past Medical History:  Diagnosis Date  . Asthma   . Plantar fasciitis     There are no active problems to display for this patient.   Past Surgical History:  Procedure Laterality Date  . CESAREAN SECTION    . NOSE SURGERY    . TONSILLECTOMY      OB History   None      Home Medications    Prior to Admission medications   Medication Sig Start Date End Date Taking? Authorizing Provider  ibuprofen (ADVIL,MOTRIN) 800 MG tablet Take 1 tablet (800 mg total) by mouth 3 (three) times daily. 11/30/15  Yes Betancourt, Jarold Song, NP  predniSONE (DELTASONE) 50 MG tablet Take 60 mg by mouth daily with breakfast.   Yes [provider]  albuterol (PROVENTIL HFA;VENTOLIN HFA) 108 (90 Base) MCG/ACT inhaler Inhale 1-2 puffs into the lungs every 6 (six) hours as needed for wheezing or shortness of breath. 09/28/15   Payton Mccallum, MD    Family History Family History  Problem Relation Age of Onset  . Cancer Mother   . Diabetes Mother   . Hypertension Mother   . Heart failure Father   . Diabetes Father   . Hyperlipidemia Father     Social History Social History   Tobacco Use  . Smoking status: Never Smoker  . Smokeless tobacco: Never Used  Substance Use Topics  . Alcohol use: No  . Drug use:  No     Allergies   Patient has no known allergies.   Review of Systems Review of Systems   Physical Exam Triage Vital Signs ED Triage Vitals  Enc Vitals Group     BP 07/20/17 1818 128/81     Pulse Rate 07/20/17 1818 74     Resp 07/20/17 1818 16     Temp 07/20/17 1818 99.2 F (37.3 C)     Temp src --      SpO2 07/20/17 1818 100 %     Weight 07/20/17 1822 180 lb (81.6 kg)     Height 07/20/17 1822  (1.6 m)     Head Circumference --      Peak Flow --      Pain Score 07/20/17 1821 4     Pain Loc --      Pain Edu? --      Excl. in [place]? --    No data found.  Updated Vital Signs BP 128/81 (BP Location: Left Arm)   Pulse 74   Temp 99.2 F (37.3 C)   Resp 16   Ht  (1.6 m)   Wt 180 lb (81.6 kg)   LMP 07/04/2017 Comment: denies preg  SpO2 100%   BMI 31.89 kg/m  Visual Acuity Right Eye Distance:   Left Eye Distance:   Bilateral Distance:    Right Eye Near:   Left Eye Near:    Bilateral Near:     Physical Exam  Constitutional: She appears well-developed and well-nourished. No distress.  HENT:  Head: Normocephalic and atraumatic.  Right Ear: Tympanic membrane, external ear and ear canal normal.  Left Ear: Tympanic membrane, external ear and ear canal normal.  Nose: Mucosal edema and rhinorrhea present. No nose lacerations, sinus tenderness, nasal deformity, septal deviation or nasal septal hematoma. No epistaxis.  No foreign bodies. Right sinus exhibits no maxillary sinus tenderness and no frontal sinus tenderness. Left sinus exhibits no maxillary sinus tenderness and no frontal sinus tenderness.  Mouth/Throat: Uvula is midline and mucous membranes are normal. No oral lesions. No uvula swelling. Posterior oropharyngeal erythema present. No oropharyngeal exudate, posterior oropharyngeal edema or tonsillar abscesses. No tonsillar exudate.  Eyes: Conjunctivae are normal. Right eye exhibits no discharge. Left eye exhibits no discharge. No scleral icterus.    Neck: Normal range of motion. Neck supple. No tracheal deviation present. No thyromegaly present.  Cardiovascular: Normal rate, regular rhythm and normal heart sounds.  Pulmonary/Chest: Effort normal and breath sounds normal. No stridor. No respiratory distress. She has no wheezes. She has no rales.  Lymphadenopathy:    She has no cervical adenopathy.  Skin: She is not diaphoretic.  Nursing note and vitals reviewed.    UC Treatments / Results  Labs (all labs ordered are listed, but only abnormal results are displayed) Labs Reviewed - No data to display  EKG None  Radiology Dg Neck Soft Tissue  Result Date: 07/20/2017 CLINICAL DATA:  Pt states she has poison ivy over different parts of her body and she has been having swelling in midline of throat and just under jaw line bilaterally x 4 days. She suspects reaction from poison ivy. Very tight feeling in throat, coughing up mucus as well. History of tonsillectomy. EXAM: NECK SOFT TISSUES - 1+ VIEW COMPARISON:  None. FINDINGS: There is no evidence of retropharyngeal soft tissue swelling or epiglottic enlargement. The cervical airway is unremarkable and no radio-opaque foreign body identified. IMPRESSION: Negative. Electronically Signed   By: Bary Richard M.D.   On: 07/20/2017 19:30    Procedures Procedures (including critical care time)  Medications Ordered in UC Medications - No data to display  Initial Impression / Assessment and Plan / UC Course  I have reviewed the triage vital signs and the nursing notes.  Pertinent labs & imaging results that were available during my care of the patient were reviewed by me and considered in my medical decision making (see chart for details).      Final Clinical Impressions(s) / UC Diagnoses   Final diagnoses:  Allergic reaction, initial encounter  Contact dermatitis due to poison ivy     Discharge Instructions     Add Zyrtec, Claritin or Allegra once daily Add Zantac twice  daily    ED Prescriptions    None     1. x-ray results and diagnosis reviewed with patient 2. Recommend continue and finish current prednisone course 3. Recommend adding otc antihistamines as stated above 4. Follow-up prn if symptoms worsen or don't improve  Controlled Substance Prescriptions [place] Controlled Substance Registry consulted? Not Applicable   Payton Mccallum, MD 07/20/17 [phone]

## 2019-01-23 IMAGING — CR DG NECK SOFT TISSUE
2 series · 2 of 2 positions shown · non-contrast
Comparison: None.

CLINICAL DATA: Pt states she has poison ivy over different parts of
her body and she has been having swelling in midline of throat and
just under jaw line bilaterally x 4 days. She suspects reaction from
poison ivy. Very tight feeling in throat, coughing up mucus as well.
History of tonsillectomy.

EXAM:
NECK SOFT TISSUES - 1+ VIEW

[neck lat]
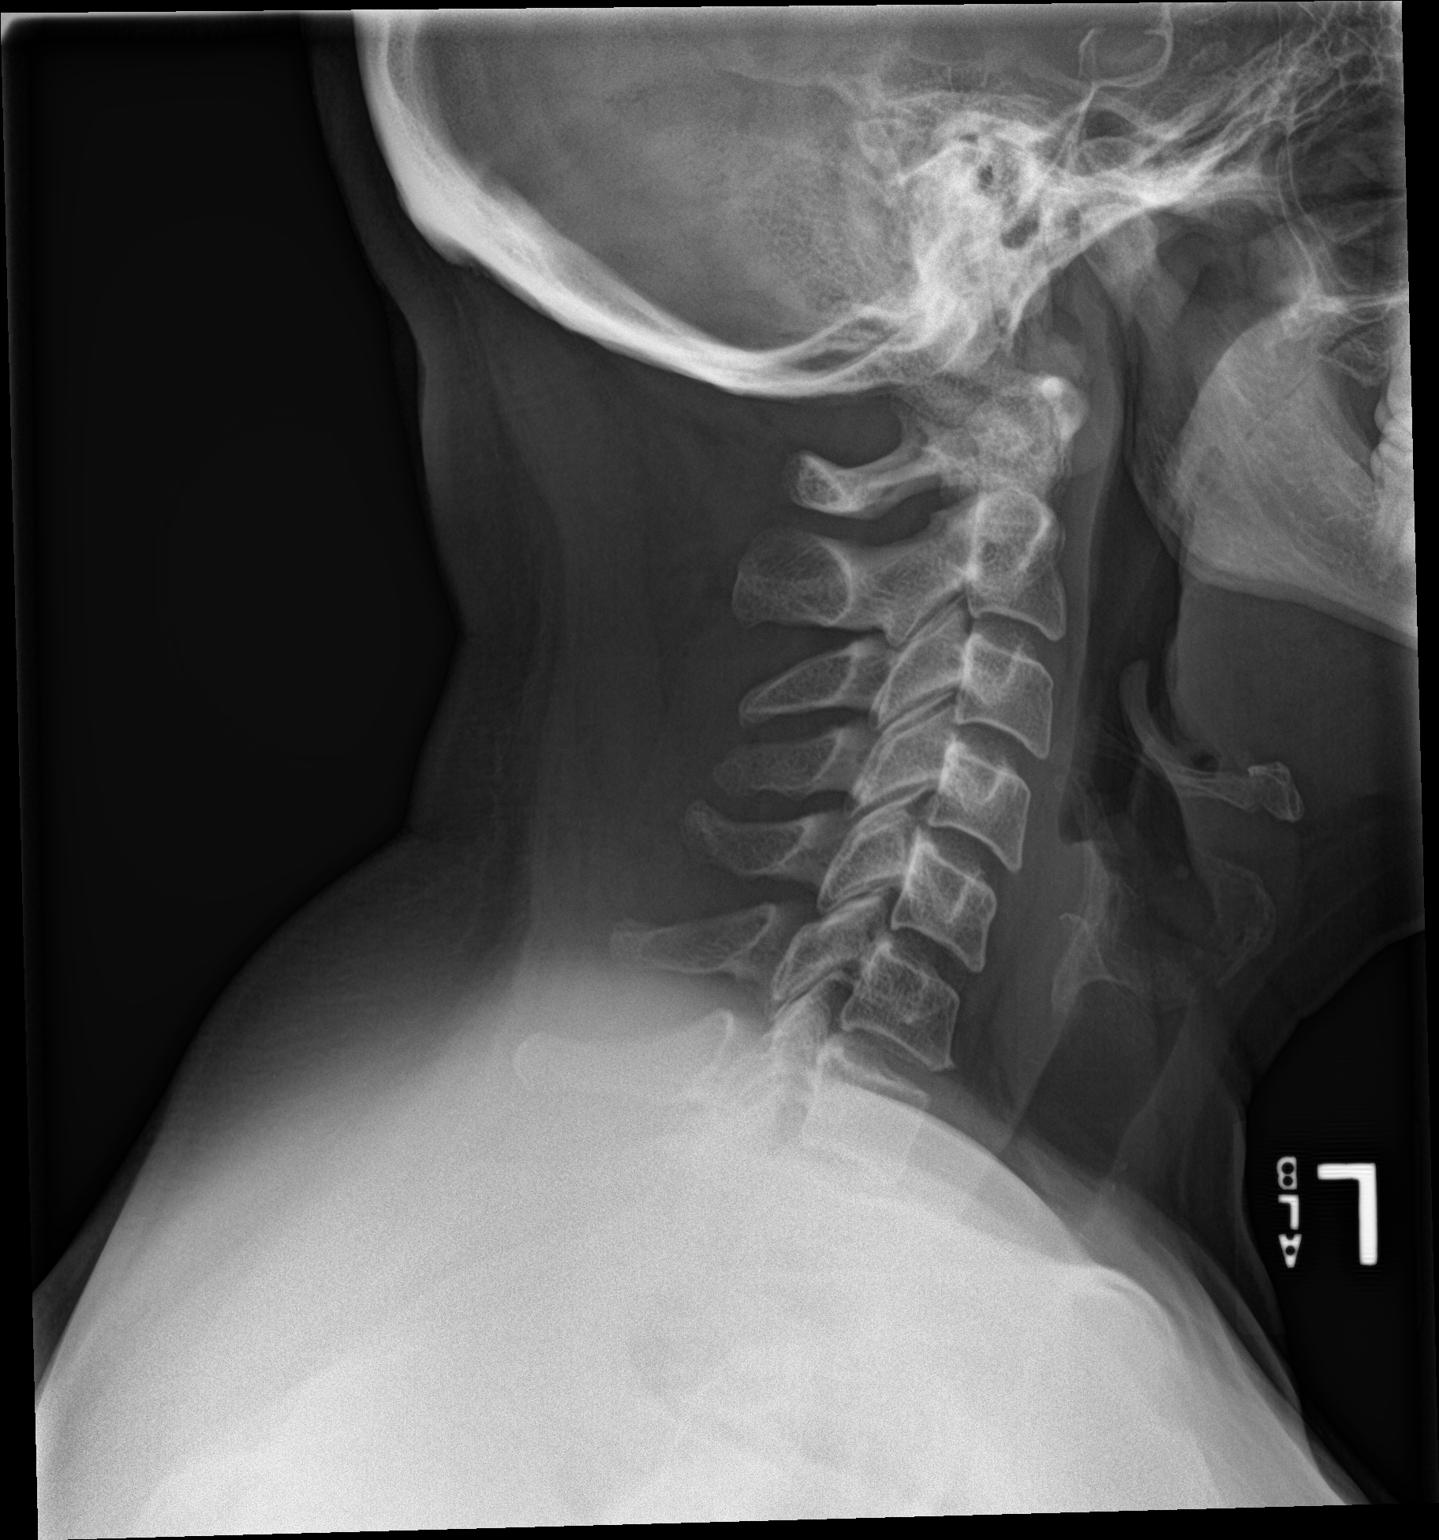

[neck ap]
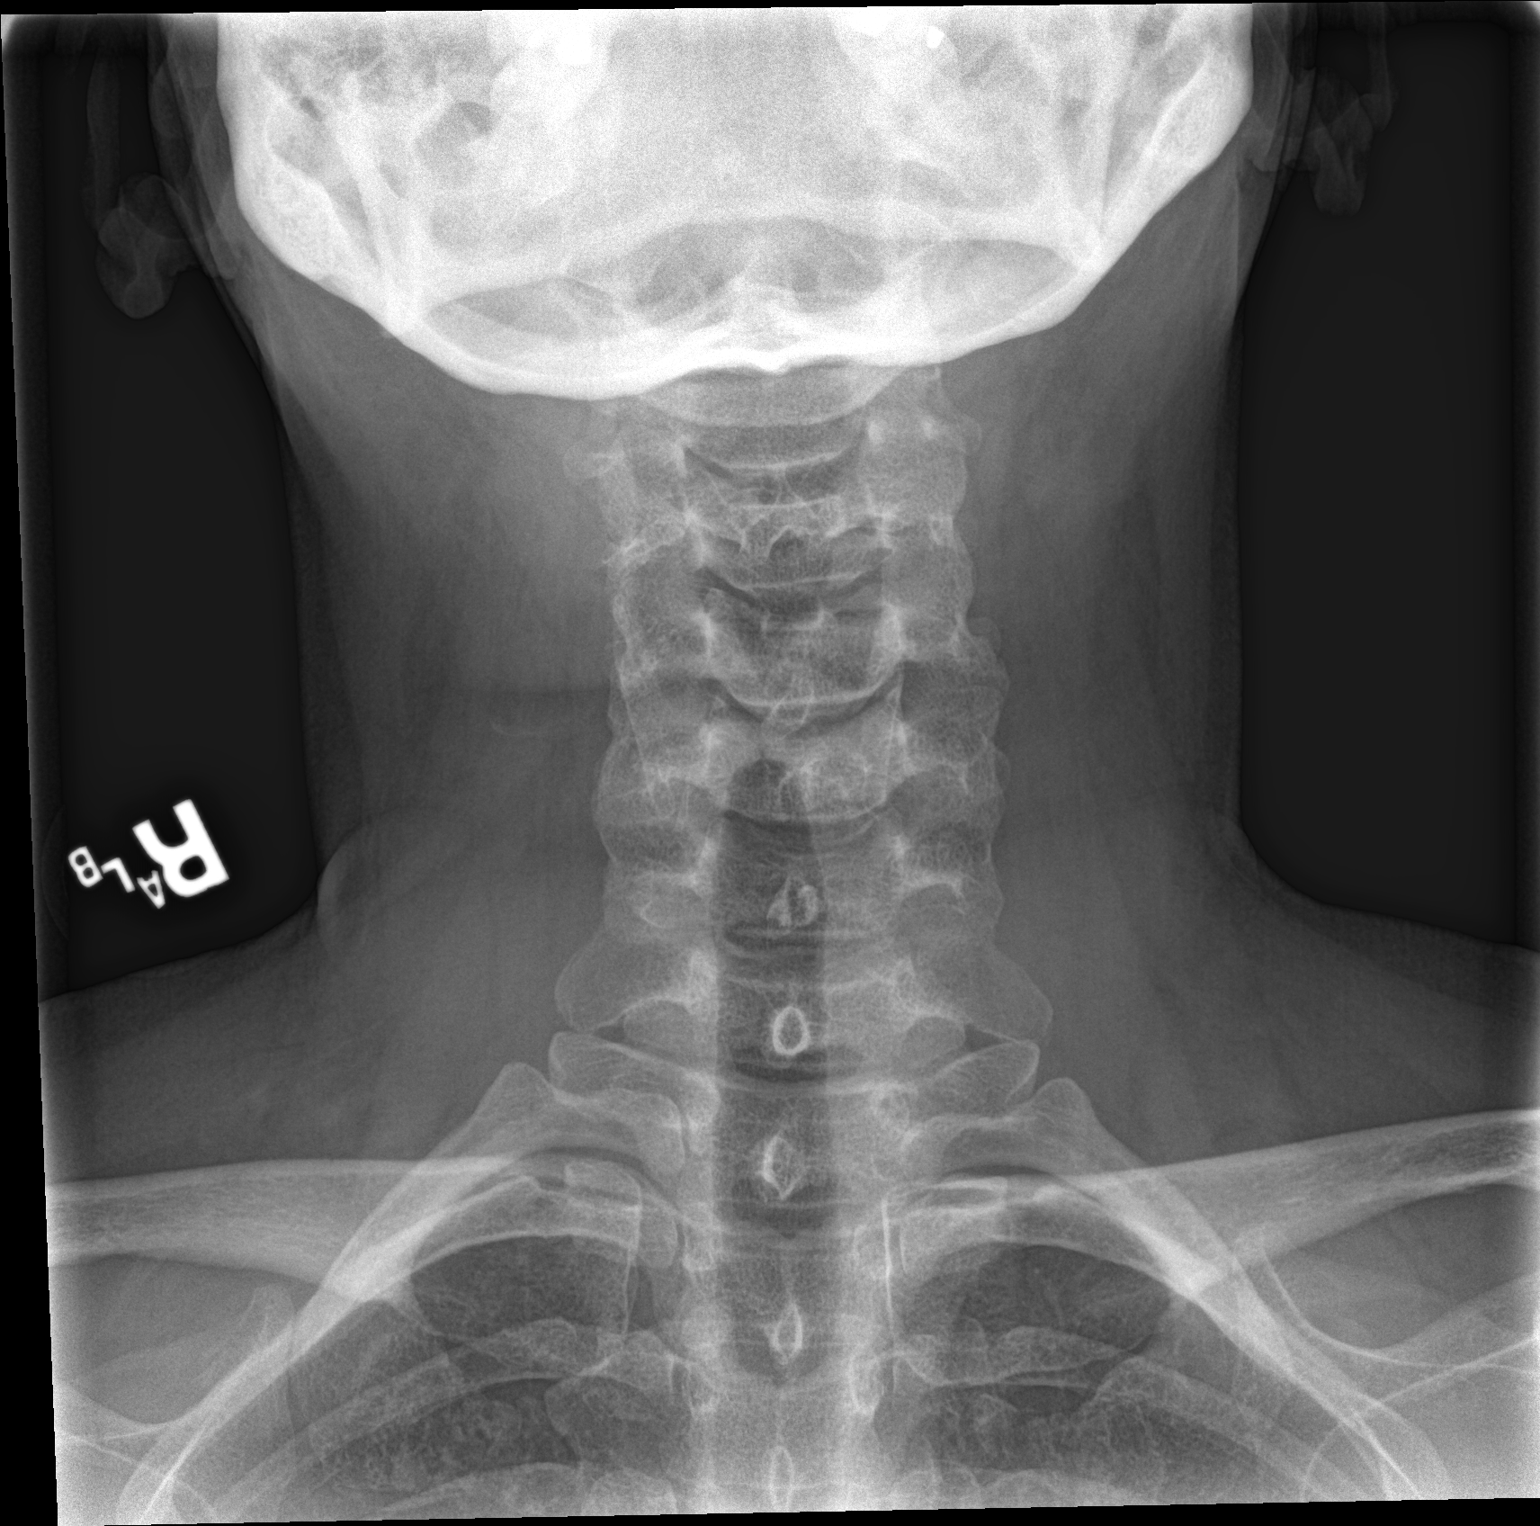

[2 of 2 positions shown; findings below may reference images not displayed]

FINDINGS: There is no evidence of retropharyngeal soft tissue swelling or
epiglottic enlargement. The cervical airway is unremarkable and no
radio-opaque foreign body identified.
IMPRESSION: Negative.

## 2019-08-13 ENCOUNTER — Ambulatory Visit
Admission: EM | Admit: 2019-08-13 | Discharge: 2019-08-13 | Disposition: A | Discharge status: Home / Self Care | Payer: No Typology Code available for payment source | Attending: Family Medicine | Admitting: Family Medicine

## 2019-08-13 ENCOUNTER — Other Ambulatory Visit: Payer: Self-pay

## 2019-08-13 DIAGNOSIS — B373 Candidiasis of vulva and vagina: Secondary | ICD-10-CM | POA: Insufficient documentation

## 2019-08-13 DIAGNOSIS — N760 Acute vaginitis: Secondary | ICD-10-CM | POA: Insufficient documentation

## 2019-08-13 DIAGNOSIS — R300 Dysuria: Secondary | ICD-10-CM | POA: Diagnosis not present

## 2019-08-13 DIAGNOSIS — B9689 Other specified bacterial agents as the cause of diseases classified elsewhere: Secondary | ICD-10-CM | POA: Insufficient documentation

## 2019-08-13 DIAGNOSIS — B3731 Acute candidiasis of vulva and vagina: Secondary | ICD-10-CM

## 2019-08-13 LAB — CHLAMYDIA/NGC RT PCR (ARMC ONLY)
Chlamydia Tr: NOT DETECTED
N gonorrhoeae: NOT DETECTED

## 2019-08-13 LAB — WET PREP, GENITAL
Sperm: NONE SEEN
Trich, Wet Prep: NONE SEEN
WBC, Wet Prep HPF POC: NONE SEEN

## 2019-08-13 LAB — URINALYSIS, COMPLETE (UACMP) WITH MICROSCOPIC
Bacteria, UA: NONE SEEN
Bilirubin Urine: NEGATIVE
Glucose, UA: NEGATIVE mg/dL
Hgb urine dipstick: NEGATIVE
Ketones, ur: NEGATIVE mg/dL
Leukocytes,Ua: NEGATIVE
Nitrite: NEGATIVE
Protein, ur: NEGATIVE mg/dL
RBC / HPF: NONE SEEN RBC/hpf (ref 0–5)
Specific Gravity, Urine: 1.025 (ref 1.005–1.030)
pH: 7 (ref 5.0–8.0)

## 2019-08-13 MED ORDER — METRONIDAZOLE 500 MG PO TABS: 500.0000 mg | ORAL_TABLET | Freq: Two times a day (BID) | ORAL | 0 refills | Status: DC

## 2019-08-13 MED ORDER — FLUCONAZOLE 150 MG PO TABS: 150.0000 mg | ORAL_TABLET | Freq: Every day | ORAL | 0 refills | Status: DC

## 2019-08-13 NOTE — ED Provider Notes (Signed)
[facility] ____________________________________________  Time seen: Approximately 10:21 AM  I have reviewed the triage vital signs and the nursing notes.   HISTORY  Chief Complaint Dysuria   HPI Monique [name] is a 49 y.o. female presenting for evaluation of 3 days of urinary frequency and urgency. States some intermittent discomfort with urination. Denies vaginal discharge, vaginal odor. States some pressure and suprapubic. Denies any other abdominal pain. Denies back pain, flank pain, vomiting, diarrhea, fevers. Continues eat and drink well. Patient reports she has a "bladder issues "in which she describes that she needs her bladder tacked. States that she with this sometimes has baseline urinary leaking in which she uses pads for. Denies any acute changes in her leaking. Patient requested to have gonorrhea and Chlamydia testing as well. Reports otherwise doing well. States that she just wanted to make sure she did not have a urinary tract infection.   Past Medical History:  Diagnosis Date  . Asthma   . Plantar fasciitis     There are no problems to display for this patient.   Past Surgical History:  Procedure Laterality Date  . CESAREAN SECTION    . NOSE SURGERY    . TONSILLECTOMY       No current facility-administered medications for this encounter.  Current Outpatient Medications:  .  emtricitabine-tenofovir (TRUVADA) 200-300 MG tablet, Take by mouth., Disp: , Rfl:  .  raltegravir (ISENTRESS) 400 MG tablet, Take by mouth., Disp: , Rfl:  .  albuterol (PROVENTIL HFA;VENTOLIN HFA) 108 (90 Base) MCG/ACT inhaler, Inhale 1-2 puffs into the lungs every 6 (six) hours as needed for wheezing or shortness of breath., Disp: 1 Inhaler, Rfl: 0 .  fluconazole (DIFLUCAN) 150 MG tablet, Take 1 tablet (150 mg total) by mouth daily. Take one pill orally, then Repeat in one week as needed., Disp: 2 tablet, Rfl: 0 .  ibuprofen (ADVIL,MOTRIN) 800 MG tablet, Take 1 tablet  (800 mg total) by mouth 3 (three) times daily., Disp: 21 tablet, Rfl: 0 .  metroNIDAZOLE (FLAGYL) 500 MG tablet, Take 1 tablet (500 mg total) by mouth 2 (two) times daily., Disp: 14 tablet, Rfl: 0 .  predniSONE (DELTASONE) 50 MG tablet, Take 60 mg by mouth daily with breakfast., Disp: , Rfl:   Allergies Patient has no known allergies.  Family History  Problem Relation Age of Onset  . Cancer Mother   . Diabetes Mother   . Hypertension Mother   . Heart failure Father   . Diabetes Father   . Hyperlipidemia Father     Social History Social History   Tobacco Use  . Smoking status: Never Smoker  . Smokeless tobacco: Never Used  Substance Use Topics  . Alcohol use: No  . Drug use: No    Review of Systems Constitutional: No fever Cardiovascular: Denies chest pain. Respiratory: Denies shortness of breath. Gastrointestinal: No abdominal pain.   Genitourinary: positive for dysuria. Musculoskeletal: Negative for back pain. Skin: Negative for rash.   ____________________________________________   PHYSICAL EXAM:  VITAL SIGNS: ED Triage Vitals  Enc Vitals Group     BP 08/13/19 0924 115/82     Pulse Rate 08/13/19 0924 68     Resp --      Temp 08/13/19 0924 98.3 F (36.8 C)     Temp Source 08/13/19 0924 Oral     SpO2 08/13/19 0924 98 %     Weight 08/13/19 0922 200 lb (90.7 kg)     Height 08/13/19 0922 5\' 3"  (1.6  m)     Head Circumference --      Peak Flow --      Pain Score 08/13/19 0921 3     Pain Loc --      Pain Edu? --      Excl. in [place]? --     Constitutional: Alert and oriented. Well appearing and in no acute distress. Eyes: Conjunctivae are normal.  ENT      Head: Normocephalic and atraumatic. Gastrointestinal: Soft and nontender.No CVA tenderness. Musculoskeletal:Steady gait. Neurologic:  Normal speech and language.  Skin:  Skin is warm, dry and intact. No rash noted. Psychiatric: Mood and affect are normal. Speech and behavior are normal. Patient exhibits  appropriate insight and judgment   ___________________________________________   LABS (all labs ordered are listed, but only abnormal results are displayed)  Labs Reviewed  WET PREP, GENITAL - Abnormal; Notable for the following components:      Result Value   Yeast Wet Prep HPF POC PRESENT (*)    Clue Cells Wet Prep HPF POC PRESENT (*)    All other components within normal limits  CHLAMYDIA/NGC RT PCR ([facility] ONLY)  URINALYSIS, COMPLETE (UACMP) WITH MICROSCOPIC     PROCEDURES Procedures    INITIAL IMPRESSION / ASSESSMENT AND PLAN / ED COURSE  Pertinent labs & imaging results that were available during my care of the patient were reviewed by me and considered in my medical decision making (see chart for details).  Labrum patient. No acute distress. Dysuria complaints. Urinalysis reviewed, clear urine. Discussed with patient evaluation wet prep GC chlamydia conservatively, patient elected for self swabs. Patient also reports that she did also noticed this after wearing a new pair of underwear without washing it and unsure if that causes irritation since it was too tight and unwashed. Monitor and supportive care.  Prep positive for yeast and clue cells.  Discussed treatment options with patient.  Will treat with Flagyl and Diflucan.  Encourage rest, fluids, supportive care.Discussed indication, risks and benefits of medications with patient.   Discussed follow up with Primary care physician this week. Discussed follow up and return parameters including no resolution or any worsening concerns. Patient verbalized understanding and agreed to plan.   ____________________________________________   FINAL CLINICAL IMPRESSION(S) / ED DIAGNOSES  Final diagnoses:  Dysuria  Bacterial vaginosis  Yeast vaginitis     ED Discharge Orders         Ordered    metroNIDAZOLE (FLAGYL) 500 MG tablet  2 times daily     08/13/19 1030    fluconazole (DIFLUCAN) 150 MG tablet  Daily      08/13/19 1030           Note: This dictation was prepared with Dragon dictation along with smaller phrase technology. Any transcriptional errors that result from this process are unintentional.         Marylene Land, NP 08/13/19 1127

## 2019-08-13 NOTE — ED Triage Notes (Signed)
Patient complains of urinary burning and urinary frequency x 3 days.

## 2019-08-13 NOTE — Discharge Instructions (Signed)
Take medication as prescribed. Rest. Drink plenty of fluids.  ° °Follow up with your primary care physician this week as needed. Return to Urgent care for new or worsening concerns.  ° °

## 2019-10-25 ENCOUNTER — Ambulatory Visit
Admission: EM | Admit: 2019-10-25 | Discharge: 2019-10-25 | Disposition: A | Discharge status: Home / Self Care | Payer: No Typology Code available for payment source | Attending: Family Medicine | Admitting: Family Medicine

## 2019-10-25 ENCOUNTER — Other Ambulatory Visit: Payer: Self-pay

## 2019-10-25 ENCOUNTER — Encounter: Payer: Self-pay | Admitting: Emergency Medicine

## 2019-10-25 DIAGNOSIS — B9789 Other viral agents as the cause of diseases classified elsewhere: Secondary | ICD-10-CM | POA: Insufficient documentation

## 2019-10-25 DIAGNOSIS — R5383 Other fatigue: Secondary | ICD-10-CM | POA: Diagnosis present

## 2019-10-25 DIAGNOSIS — J988 Other specified respiratory disorders: Secondary | ICD-10-CM | POA: Diagnosis not present

## 2019-10-25 DIAGNOSIS — Z885 Allergy status to narcotic agent status: Secondary | ICD-10-CM | POA: Diagnosis not present

## 2019-10-25 DIAGNOSIS — J45909 Unspecified asthma, uncomplicated: Secondary | ICD-10-CM | POA: Insufficient documentation

## 2019-10-25 DIAGNOSIS — Z20822 Contact with and (suspected) exposure to covid-19: Secondary | ICD-10-CM | POA: Insufficient documentation

## 2019-10-25 DIAGNOSIS — R6883 Chills (without fever): Secondary | ICD-10-CM | POA: Insufficient documentation

## 2019-10-25 LAB — GROUP A STREP BY PCR: Group A Strep by PCR: NOT DETECTED

## 2019-10-25 MED ORDER — KETOROLAC TROMETHAMINE 10 MG PO TABS: 10.0000 mg | ORAL_TABLET | Freq: Four times a day (QID) | ORAL | 0 refills | Status: DC | PRN

## 2019-10-25 NOTE — ED Triage Notes (Signed)
Patient in today c/o sore throat, headache, stiff neck, abdominal pain and chills x 3 days. Patient has not taken any OTC medications for her symptoms. Patient completed Pfizer covid vaccine on 10/12/19.

## 2019-10-25 NOTE — Discharge Instructions (Signed)
Rest.  Heat for the neck.  Medication as directed. Will help sore throat and neck pain.  Awaiting COVID test results. Should return in 24 hours.  Lots of fluids.  Take care  Dr. Adriana Simas

## 2019-10-25 NOTE — ED Provider Notes (Signed)
[facility]    CSN: [id number] Arrival date & time: 10/25/19  1538      History   Chief Complaint Chief Complaint  Patient presents with  . Torticollis  . Headache  . Abdominal Pain  . Chills  . Sore Throat   HPI   49 year old female presents with the above complaints.  Patient states that she is on day 3 of illness.  She states that she feels poorly.  She reports fatigue, sore throat, neck stiffness, headache, chills.  No documented fever.  She reports that she has had some sharp abdominal pain yesterday but this is now resolved.  Patient reports that she has recently been on vacation.  Her daughter has been sick as well.  No known exposures to COVID-19.  No relieving factors.  No medications or interventions tried.  Patient has had her Covid vaccine.  No other complaints at this time.  Past Medical History:  Diagnosis Date  . Asthma   . Plantar fasciitis    Past Surgical History:  Procedure Laterality Date  . CESAREAN SECTION    . NOSE SURGERY    . TONSILLECTOMY      OB History   No obstetric history on file.      Home Medications    Prior to Admission medications   Medication Sig Start Date End Date Taking? Authorizing Provider  ketorolac (TORADOL) 10 MG tablet Take 1 tablet (10 mg total) by mouth every 6 (six) hours as needed for moderate pain or severe pain. 10/25/19   Tommie Sams, DO  albuterol (PROVENTIL HFA;VENTOLIN HFA) 108 (90 Base) MCG/ACT inhaler Inhale 1-2 puffs into the lungs every 6 (six) hours as needed for wheezing or shortness of breath. 09/28/15 10/25/19  Payton Mccallum, MD    Family History Family History  Problem Relation Age of Onset  . Cancer Mother   . Diabetes Mother   . Hypertension Mother   . Heart failure Father   . Diabetes Father   . Hyperlipidemia Father     Social History Social History   Tobacco Use  . Smoking status: Never Smoker  . Smokeless tobacco: Never Used  Vaping Use  . Vaping Use: Never used   Substance Use Topics  . Alcohol use: No  . Drug use: No     Allergies   Morphine   Review of Systems Review of Systems Per HPI  Physical Exam Triage Vital Signs ED Triage Vitals  Enc Vitals Group     BP 10/25/19 1558 102/65     Pulse Rate 10/25/19 1558 66     Resp 10/25/19 1558 18     Temp 10/25/19 1558 98.9 F (37.2 C)     Temp Source 10/25/19 1558 Oral     SpO2 10/25/19 1558 98 %     Weight 10/25/19 1558 177 lb (80.3 kg)     Height 10/25/19 1558 5\' 3"  (1.6 m)     Head Circumference --      Peak Flow --      Pain Score 10/25/19 1557 7     Pain Loc --      Pain Edu? --      Excl. in GC? --    Updated Vital Signs BP 102/65 (BP Location: Left Arm)   Pulse 66   Temp 98.9 F (37.2 C) (Oral)   Resp 18   Ht 5\' 3"  (1.6 m)   Wt 80.3 kg   LMP 10/20/2019 (Exact Date)   SpO2 98%  BMI 31.35 kg/m   Visual Acuity Right Eye Distance:   Left Eye Distance:   Bilateral Distance:    Right Eye Near:   Left Eye Near:    Bilateral Near:     Physical Exam Vitals and nursing note reviewed.  Constitutional:      General: She is not in acute distress.    Appearance: She is obese. She is not ill-appearing.  HENT:     Head: Normocephalic and atraumatic.     Mouth/Throat:     Pharynx: Oropharynx is clear. No posterior oropharyngeal erythema.  Eyes:     General:        Right eye: No discharge.        Left eye: No discharge.     Conjunctiva/sclera: Conjunctivae normal.  Neck:     Comments: Decreased range of motion in right left rotation. Cardiovascular:     Rate and Rhythm: Normal rate and regular rhythm.     Heart sounds: No murmur heard.   Pulmonary:     Effort: Pulmonary effort is normal.     Breath sounds: Normal breath sounds. No wheezing, rhonchi or rales.  Neurological:     Mental Status: She is alert.    UC Treatments / Results  Labs (all labs ordered are listed, but only abnormal results are displayed) Labs Reviewed  GROUP A STREP BY PCR  SARS  CORONAVIRUS 2 (TAT 6-24 HRS)    EKG   Radiology No results found.  Procedures Procedures (including critical care time)  Medications Ordered in UC Medications - No data to display  Initial Impression / Assessment and Plan / UC Course  I have reviewed the triage vital signs and the nursing notes.  Pertinent labs & imaging results that were available during my care of the patient were reviewed by me and considered in my medical decision making (see chart for details).    49 year old female presents with a viral respiratory infection.  No evidence of meningeal signs at this time.  Exam unremarkable.  Strep test negative.  Awaiting Covid test results.  Advised supportive care and I have prescribed Toradol for her pain.  Final Clinical Impressions(s) / UC Diagnoses   Final diagnoses:  Viral respiratory infection     Discharge Instructions     Rest.  Heat for the neck.  Medication as directed. Will help sore throat and neck pain.  Awaiting COVID test results. Should return in 24 hours.  Lots of fluids.  Take care  Dr. Adriana Simas    ED Prescriptions    Medication Sig Dispense Auth. Provider   ketorolac (TORADOL) 10 MG tablet Take 1 tablet (10 mg total) by mouth every 6 (six) hours as needed for moderate pain or severe pain. 20 tablet Tommie Sams, DO     PDMP not reviewed this encounter.   Tommie Sams, [place] 10/25/19 1649

## 2019-10-26 LAB — SARS CORONAVIRUS 2 (TAT 6-24 HRS): SARS Coronavirus 2: NEGATIVE

## 2019-10-29 ENCOUNTER — Ambulatory Visit
Admission: EM | Admit: 2019-10-29 | Discharge: 2019-10-29 | Disposition: A | Discharge status: Home / Self Care | Payer: No Typology Code available for payment source | Attending: Family Medicine | Admitting: Family Medicine

## 2019-10-29 ENCOUNTER — Other Ambulatory Visit: Payer: Self-pay

## 2019-10-29 ENCOUNTER — Encounter: Payer: Self-pay | Admitting: Emergency Medicine

## 2019-10-29 DIAGNOSIS — Z20822 Contact with and (suspected) exposure to covid-19: Secondary | ICD-10-CM | POA: Diagnosis present

## 2019-10-29 DIAGNOSIS — M542 Cervicalgia: Secondary | ICD-10-CM | POA: Diagnosis present

## 2019-10-29 MED ORDER — KETOROLAC TROMETHAMINE 10 MG PO TABS: 10.0000 mg | ORAL_TABLET | Freq: Four times a day (QID) | ORAL | 0 refills | Status: AC | PRN

## 2019-10-29 NOTE — Discharge Instructions (Signed)
NECK PAIN: Stressed avoiding painful activities. This can exacerbate your symptoms and make them worse.  May apply heat to the areas of pain for some relief. Use medications as directed. Be aware of which medications make you drowsy and do not drive or operate any kind of heavy machinery while using the medication (ie pain medications or muscle relaxers). F/U with PCP for reexamination or return sooner if condition worsens or does not begin to improve over the next few days.   NECK PAIN RED FLAGS: If symptoms get worse than they are right now, you should come back sooner for re-evaluation. If you have increased numbness/ tingling or notice that the numbness/tingling is affecting the legs or saddle region, go to ER. If you ever lose continence go to ER.    If fever, increased neck stiffness or pain, go to ED  You have received COVID testing today either for positive exposure, concerning symptoms that could be related to COVID infection, screening purposes, or re-testing after confirmed positive.  Your test obtained today checks for active viral infection in the last 1-2 weeks. If your test is negative now, you can still test positive later. So, if you do develop symptoms you should either get re-tested and/or isolate x 10 days. Please follow CDC guidelines.  While Rapid antigen tests come back in 15-20 minutes, send out PCR/molecular test results typically come back within 24 hours. In the mean time, if you are symptomatic, assume this could be a positive test and treat/monitor yourself as if you do have COVID.   We will call with test results. Please download the MyChart app and set up a profile to access test results.   If symptomatic, go home and rest. Push fluids. Take Tylenol as needed for discomfort. Gargle warm salt water. Throat lozenges. Take Mucinex DM or Robitussin for cough. Humidifier in bedroom to ease coughing. Warm showers. Also review the COVID handout for more information.  COVID-19  INFECTION: The incubation period of COVID-19 is approximately 14 days after exposure, with most symptoms developing in roughly 4-5 days. Symptoms may range in severity from mild to critically severe. Roughly 80% of those infected will have mild symptoms. People of any age may become infected with COVID-19 and have the ability to transmit the virus. The most common symptoms include: fever, fatigue, cough, body aches, headaches, sore throat, nasal congestion, shortness of breath, nausea, vomiting, diarrhea, changes in smell and/or taste.    COURSE OF ILLNESS Some patients may begin with mild disease which can progress quickly into critical symptoms. If your symptoms are worsening please call ahead to the Emergency Department and proceed there for further treatment. Recovery time appears to be roughly 1-2 weeks for mild symptoms and 3-6 weeks for severe disease.   GO IMMEDIATELY TO ER FOR FEVER YOU ARE UNABLE TO GET DOWN WITH TYLENOL, BREATHING PROBLEMS, CHEST PAIN, FATIGUE, LETHARGY, INABILITY TO EAT OR DRINK, ETC  QUARANTINE AND ISOLATION: To help decrease the spread of COVID-19 please remain isolated if you have COVID infection or are highly suspected to have COVID infection. This means -stay home and isolate to one room in the home if you live with others. Do not share a bed or bathroom with others while ill, sanitize and wipe down all countertops and keep common areas clean and disinfected. You may discontinue isolation if you have a mild case and are asymptomatic 10 days after symptom onset as long as you have been fever free >24 hours without having to take  Motrin or Tylenol. If your case is more severe (meaning you develop pneumonia or are admitted in the hospital), you may have to isolate longer.   If you have been in close contact (within 6 feet) of someone diagnosed with COVID 19, you are advised to quarantine in your home for 14 days as symptoms can develop anywhere from 2-14 days after exposure to  the virus. If you develop symptoms, you  must isolate.  Most current guidelines for COVID after exposure -isolate 10 days if you ARE NOT tested for COVID as long as symptoms do not develop -isolate 7 days if you are tested and remain asymptomatic -You do not necessarily need to be tested for COVID if you have + exposure and        develop   symptoms. Just isolate at home x10 days from symptom onset During this global pandemic, CDC advises to practice social distancing, try to stay at least 19ft away from others at all times. Wear a face covering. Wash and sanitize your hands regularly and avoid going anywhere that is not necessary.  KEEP IN MIND THAT THE COVID TEST IS NOT 100% ACCURATE AND YOU SHOULD STILL DO EVERYTHING TO PREVENT POTENTIAL SPREAD OF VIRUS TO OTHERS (WEAR MASK, WEAR GLOVES, WASH HANDS AND SANITIZE REGULARLY). IF INITIAL TEST IS NEGATIVE, THIS MAY NOT MEAN YOU ARE DEFINITELY NEGATIVE. MOST ACCURATE TESTING IS DONE 5-7 DAYS AFTER EXPOSURE.   It is not advised by CDC to get re-tested after receiving a positive COVID test since you can still test positive for weeks to months after you have already cleared the virus.   *If you have not been vaccinated for COVID, I strongly suggest you consider getting vaccinated as long as there are no contraindications.

## 2019-10-29 NOTE — ED Triage Notes (Signed)
Patient states that she was notified that she was exposed to someone with COVID.  Patient states that she here for a COVID test.  Patient also c/o ongoing neck pain and stiffness.

## 2019-10-29 NOTE — ED Provider Notes (Signed)
[facility]    CSN: [id number] Arrival date & time: 10/29/19  1315      History   Chief Complaint Chief Complaint  Patient presents with  . Neck Pain    HPI Monique [name] is a 49 y.o. female.   49 y/o female follows back up for left sided neck pain and stiffness x 5 days. She says she went to the [place] ED a couple of days ago and was given ketorolac and morphine for the pain. She says the neck pain and pressure is improving. She says the ED physician thought she might have a lot of neck pain due to inflammation from possible viral infection or to the COVID vaccine which she had 2 weeks ago. She says she had a CTA of the neck and chest and those were normal. She says the throat is sore and she has swollen lymph nodes, but that is improving. She says she was seen at the ED and had elevated inflammatory markers with other normal results. She was sent home with prescriptions for Aleve, but says she had more improvement with ketorolac than Aleve. She is hoping to have a prescription for ketorolac. She says she was never able to pick up the prescription sent by Dr. Adriana Simas 4 days ago.  Patient says she recently found out that she could have been exposed to COVID so she would like another COVID test. She says her employer would like her to have the test. Patient denies cough, congestion, chest pain, SOB, abdominal pain, n/v/d. No other complaints or concerns today.     Past Medical History:  Diagnosis Date  . Asthma   . Plantar fasciitis     There are no problems to display for this patient.   Past Surgical History:  Procedure Laterality Date  . CESAREAN SECTION    . NOSE SURGERY    . TONSILLECTOMY      OB History   No obstetric history on file.      Home Medications    Prior to Admission medications   Medication Sig Start Date End Date Taking? Authorizing Provider  ketorolac (TORADOL) 10 MG tablet Take 1 tablet (10 mg total) by mouth every 6 (six) hours as  needed for up to 5 days for severe pain. 10/29/19 11/03/19  Eusebio Friendly B, PA-C  albuterol (PROVENTIL HFA;VENTOLIN HFA) 108 (90 Base) MCG/ACT inhaler Inhale 1-2 puffs into the lungs every 6 (six) hours as needed for wheezing or shortness of breath. 09/28/15 10/25/19  Payton Mccallum, MD    Family History Family History  Problem Relation Age of Onset  . Cancer Mother   . Diabetes Mother   . Hypertension Mother   . Heart failure Father   . Diabetes Father   . Hyperlipidemia Father     Social History Social History   Tobacco Use  . Smoking status: Never Smoker  . Smokeless tobacco: Never Used  Vaping Use  . Vaping Use: Never used  Substance Use Topics  . Alcohol use: No  . Drug use: No     Allergies   Morphine   Review of Systems Review of Systems  Constitutional: Positive for fatigue. Negative for chills, diaphoresis and fever.  HENT: Positive for sore throat. Negative for congestion, ear pain, rhinorrhea, sinus pressure and sinus pain.   Respiratory: Negative for cough and shortness of breath.   Gastrointestinal: Negative for abdominal pain, nausea and vomiting.  Musculoskeletal: Positive for neck pain and neck stiffness. Negative for arthralgias  and myalgias.  Skin: Negative for rash.  Neurological: Negative for weakness and headaches.  Hematological: Positive for adenopathy.     Physical Exam Triage Vital Signs ED Triage Vitals  Enc Vitals Group     BP 10/29/19 1344 115/69     Pulse Rate 10/29/19 1344 73     Resp 10/29/19 1344 14     Temp 10/29/19 1344 99.3 F (37.4 C)     Temp Source 10/29/19 1344 Oral     SpO2 10/29/19 1344 97 %     Weight 10/29/19 1341 176 lb (79.8 kg)     Height 10/29/19 1341 5\' 2"  (1.575 m)     Head Circumference --      Peak Flow --      Pain Score 10/29/19 1341 3     Pain Loc --      Pain Edu? --      Excl. in GC? --    No data found.  Updated Vital Signs BP 115/69 (BP Location: Left Arm)   Pulse 73   Temp 99.3 F (37.4 C)  (Oral)   Resp 14   Ht 5\' 2"  (1.575 m)   Wt 176 lb (79.8 kg)   LMP 10/20/2019 (Exact Date)   SpO2 97%   BMI 32.19 kg/m       Physical Exam Vitals and nursing note reviewed.  Constitutional:      General: She is not in acute distress.    Appearance: Normal appearance. She is not ill-appearing or toxic-appearing.  HENT:     Head: Normocephalic and atraumatic.     Nose: Nose normal.     Mouth/Throat:     Mouth: Mucous membranes are moist.     Pharynx: Oropharynx is clear.  Eyes:     General: No scleral icterus.       Right eye: No discharge.        Left eye: No discharge.     Conjunctiva/sclera: Conjunctivae normal.  Neck:     Comments: Negative meningeal signs Cardiovascular:     Rate and Rhythm: Normal rate and regular rhythm.     Heart sounds: Normal heart sounds.  Pulmonary:     Effort: Pulmonary effort is normal. No respiratory distress.     Breath sounds: Normal breath sounds.  Musculoskeletal:     Cervical back: Normal range of motion and neck supple. Tenderness (diffuse moderate TTP left paracervical muscles and SCM as well as left trapezius) present. No rigidity.  Lymphadenopathy:     Cervical: Cervical adenopathy present.  Skin:    General: Skin is dry.  Neurological:     General: No focal deficit present.     Mental Status: She is alert. Mental status is at baseline.     Motor: No weakness.     Gait: Gait normal.  Psychiatric:        Mood and Affect: Mood normal.        Behavior: Behavior normal.        Thought Content: Thought content normal.      UC Treatments / Results  Labs (all labs ordered are listed, but only abnormal results are displayed) Labs Reviewed  SARS CORONAVIRUS 2 (TAT 6-24 HRS)    EKG   Radiology No results found.  Procedures Procedures (including critical care time)  Medications Ordered in UC Medications - No data to display  Initial Impression / Assessment and Plan / UC Course  I have reviewed the triage vital signs  and the nursing  notes.  Pertinent labs & imaging results that were available during my care of the patient were reviewed by me and considered in my medical decision making (see chart for details).   [facility]. She says she has had full work up for her neck pain including CTA and multiple labs. All testing normal and she says no concerns from ED physician for meningitis. She has had improvement, about 50% she says, since ED visit. Negative meningeal signs today. Neck tenderness seems muscular based on exam today. Full ROM and mild stiffness. Vitals are stable today. Sent in Rx for ketorolac since patient denies ever receiving this medication. Explained that it is only for short term use and can not use longer than 5 days. COVID testing obtained, but low suspicion for COVID infection. CDC guidelines reviewed and isolation protocol if positive. ED precautions for neck pain and COVID reviewed with patient.  Final Clinical Impressions(s) / UC Diagnoses   Final diagnoses:  Neck pain  Exposure to COVID-19 virus     Discharge Instructions     NECK PAIN: Stressed avoiding painful activities. This can exacerbate your symptoms and make them worse.  May apply heat to the areas of pain for some relief. Use medications as directed. Be aware of which medications make you drowsy and do not drive or operate any kind of heavy machinery while using the medication (ie pain medications or muscle relaxers). F/U with PCP for reexamination or return sooner if condition worsens or does not begin to improve over the next few days.   NECK PAIN RED FLAGS: If symptoms get worse than they are right now, you should come back sooner for re-evaluation. If you have increased numbness/ tingling or notice that the numbness/tingling is affecting the legs or saddle region, go to ER. If you ever lose continence go to ER.    If fever, increased neck stiffness or pain, go  to ED  You have received COVID testing today either for positive exposure, concerning symptoms that could be related to COVID infection, screening purposes, or re-testing after confirmed positive.  Your test obtained today checks for active viral infection in the last 1-2 weeks. If your test is negative now, you can still test positive later. So, if you do develop symptoms you should either get re-tested and/or isolate x 10 days. Please follow CDC guidelines.  While Rapid antigen tests come back in 15-20 minutes, send out PCR/molecular test results typically come back within 24 hours. In the mean time, if you are symptomatic, assume this could be a positive test and treat/monitor yourself as if you do have COVID.   We will call with test results. Please download the MyChart app and set up a profile to access test results.   If symptomatic, go home and rest. Push fluids. Take Tylenol as needed for discomfort. Gargle warm salt water. Throat lozenges. Take Mucinex DM or Robitussin for cough. Humidifier in bedroom to ease coughing. Warm showers. Also review the COVID handout for more information.  COVID-19 INFECTION: The incubation period of COVID-19 is approximately 14 days after exposure, with most symptoms developing in roughly 4-5 days. Symptoms may range in severity from mild to critically severe. Roughly 80% of those infected will have mild symptoms. People of any age may become infected with COVID-19 and have the ability to transmit the virus. The most common symptoms include: fever, fatigue, cough, body aches, headaches, sore throat,  nasal congestion, shortness of breath, nausea, vomiting, diarrhea, changes in smell and/or taste.    COURSE OF ILLNESS Some patients may begin with mild disease which can progress quickly into critical symptoms. If your symptoms are worsening please call ahead to the Emergency Department and proceed there for further treatment. Recovery time appears to be roughly 1-2  weeks for mild symptoms and 3-6 weeks for severe disease.   GO IMMEDIATELY TO ER FOR FEVER YOU ARE UNABLE TO GET DOWN WITH TYLENOL, BREATHING PROBLEMS, CHEST PAIN, FATIGUE, LETHARGY, INABILITY TO EAT OR DRINK, ETC  QUARANTINE AND ISOLATION: To help decrease the spread of COVID-19 please remain isolated if you have COVID infection or are highly suspected to have COVID infection. This means -stay home and isolate to one room in the home if you live with others. Do not share a bed or bathroom with others while ill, sanitize and wipe down all countertops and keep common areas clean and disinfected. You may discontinue isolation if you have a mild case and are asymptomatic 10 days after symptom onset as long as you have been fever free >24 hours without having to take Motrin or Tylenol. If your case is more severe (meaning you develop pneumonia or are admitted in the hospital), you may have to isolate longer.   If you have been in close contact (within 6 feet) of someone diagnosed with COVID 19, you are advised to quarantine in your home for 14 days as symptoms can develop anywhere from 2-14 days after exposure to the virus. If you develop symptoms, you  must isolate.  Most current guidelines for COVID after exposure -isolate 10 days if you ARE NOT tested for COVID as long as symptoms do not develop -isolate 7 days if you are tested and remain asymptomatic -You do not necessarily need to be tested for COVID if you have + exposure and        develop   symptoms. Just isolate at home x10 days from symptom onset During this global pandemic, CDC advises to practice social distancing, try to stay at least 91ft away from others at all times. Wear a face covering. Wash and sanitize your hands regularly and avoid going anywhere that is not necessary.  KEEP IN MIND THAT THE COVID TEST IS NOT 100% ACCURATE AND YOU SHOULD STILL DO EVERYTHING TO PREVENT POTENTIAL SPREAD OF VIRUS TO OTHERS (WEAR MASK, WEAR GLOVES, WASH  HANDS AND SANITIZE REGULARLY). IF INITIAL TEST IS NEGATIVE, THIS MAY NOT MEAN YOU ARE DEFINITELY NEGATIVE. MOST ACCURATE TESTING IS DONE 5-7 DAYS AFTER EXPOSURE.   It is not advised by CDC to get re-tested after receiving a positive COVID test since you can still test positive for weeks to months after you have already cleared the virus.   *If you have not been vaccinated for COVID, I strongly suggest you consider getting vaccinated as long as there are no contraindications.      ED Prescriptions    Medication Sig Dispense Auth. Provider   ketorolac (TORADOL) 10 MG tablet Take 1 tablet (10 mg total) by mouth every 6 (six) hours as needed for up to 5 days for severe pain. 20 tablet Gareth Morgan     PDMP not reviewed this encounter.   Shirlee Latch, PA-C 10/30/19 [phone]

## 2019-10-30 ENCOUNTER — Encounter: Payer: Self-pay | Admitting: Physician Assistant

## 2019-10-30 LAB — SARS CORONAVIRUS 2 (TAT 6-24 HRS): SARS Coronavirus 2: NEGATIVE

## 2019-11-08 ENCOUNTER — Ambulatory Visit
Admission: EM | Admit: 2019-11-08 | Discharge: 2019-11-08 | Disposition: A | Discharge status: Home / Self Care | Payer: No Typology Code available for payment source | Attending: Internal Medicine | Admitting: Internal Medicine

## 2019-11-08 ENCOUNTER — Encounter: Payer: Self-pay | Admitting: Emergency Medicine

## 2019-11-08 ENCOUNTER — Other Ambulatory Visit: Payer: Self-pay

## 2019-11-08 DIAGNOSIS — J45909 Unspecified asthma, uncomplicated: Secondary | ICD-10-CM | POA: Insufficient documentation

## 2019-11-08 DIAGNOSIS — Z885 Allergy status to narcotic agent status: Secondary | ICD-10-CM | POA: Insufficient documentation

## 2019-11-08 DIAGNOSIS — B9789 Other viral agents as the cause of diseases classified elsewhere: Secondary | ICD-10-CM | POA: Diagnosis not present

## 2019-11-08 DIAGNOSIS — Z79899 Other long term (current) drug therapy: Secondary | ICD-10-CM | POA: Insufficient documentation

## 2019-11-08 DIAGNOSIS — Z20822 Contact with and (suspected) exposure to covid-19: Secondary | ICD-10-CM | POA: Insufficient documentation

## 2019-11-08 DIAGNOSIS — J019 Acute sinusitis, unspecified: Secondary | ICD-10-CM | POA: Diagnosis present

## 2019-11-08 LAB — SARS CORONAVIRUS 2 (TAT 6-24 HRS): SARS Coronavirus 2: NEGATIVE

## 2019-11-08 MED ORDER — FLUTICASONE PROPIONATE 50 MCG/ACT NA SUSP: 1.0000 | Freq: Every day | NASAL | 0 refills | Status: DC

## 2019-11-08 MED ORDER — GUAIFENESIN ER 600 MG PO TB12: 600.0000 mg | ORAL_TABLET | Freq: Two times a day (BID) | ORAL | 0 refills | Status: AC

## 2019-11-08 NOTE — ED Triage Notes (Signed)
Pt c/o nasal congestion, sore throat, headache. Started about 2 days ago. She was exposed to covid over a week ago. Denies fever.

## 2019-11-08 NOTE — Discharge Instructions (Signed)
Saline nasal spray twice daily Increase oral fluid intake  Cepacol throat spray Return if symptoms worsens.

## 2019-11-10 NOTE — ED Provider Notes (Addendum)
[facility]    CSN: [id number] Arrival date & time: 11/08/19  1349      History   Chief Complaint Chief Complaint  Patient presents with  . Nasal Congestion    HPI Monique [name] is a 49 y.o. female comes to the urgent care complaining of nasal congestion, sore throat and a headache of 2 days duration. Nasal discharge is clear. No shortness of breath or wheezing patient was exposed to a COVID-19 positive individual about a week ago. She denies any fever, chills or generalized body aches. No shortness of breath, cough or wheezing. No chest pain or chest pressure.   HPI  Past Medical History:  Diagnosis Date  . Asthma   . Plantar fasciitis     There are no problems to display for this patient.   Past Surgical History:  Procedure Laterality Date  . CESAREAN SECTION    . NOSE SURGERY    . TONSILLECTOMY      OB History   No obstetric history on file.      Home Medications    Prior to Admission medications   Medication Sig Start Date End Date Taking? Authorizing Provider  fluticasone (FLONASE) 50 MCG/ACT nasal spray Place 1 spray into both nostrils daily. 11/08/19   Merrilee Jansky, MD  guaiFENesin (MUCINEX) 600 MG 12 hr tablet Take 1 tablet (600 mg total) by mouth 2 (two) times daily for 10 days. 11/08/19 11/18/19  LampteyBritta Mccreedy, MD  albuterol (PROVENTIL HFA;VENTOLIN HFA) 108 (90 Base) MCG/ACT inhaler Inhale 1-2 puffs into the lungs every 6 (six) hours as needed for wheezing or shortness of breath. 09/28/15 10/25/19  Payton Mccallum, MD    Family History Family History  Problem Relation Age of Onset  . Cancer Mother   . Diabetes Mother   . Hypertension Mother   . Heart failure Father   . Diabetes Father   . Hyperlipidemia Father     Social History Social History   Tobacco Use  . Smoking status: Never Smoker  . Smokeless tobacco: Never Used  Vaping Use  . Vaping Use: Never used  Substance Use Topics  . Alcohol use: No  . Drug use: No      Allergies   Morphine   Review of Systems Review of Systems  Constitutional: Negative.   HENT: Positive for congestion and sinus pressure. Negative for ear discharge, ear pain, sinus pain and sore throat.   Respiratory: Negative.  Negative for cough and shortness of breath.   Cardiovascular: Negative.   Gastrointestinal: Negative.      Physical Exam Triage Vital Signs ED Triage Vitals  Enc Vitals Group     BP 11/08/19 1428 114/79     Pulse Rate 11/08/19 1428 80     Resp 11/08/19 1428 18     Temp 11/08/19 1428 98.9 F (37.2 C)     Temp Source 11/08/19 1428 Oral     SpO2 11/08/19 1428 98 %     Weight 11/08/19 1425 175 lb 14.8 oz (79.8 kg)     Height 11/08/19 1425 5\' 2"  (1.575 m)     Head Circumference --      Peak Flow --      Pain Score 11/08/19 1426 5     Pain Loc --      Pain Edu? --      Excl. in GC? --    No data found.  Updated Vital Signs BP 114/79 (BP Location: Right Arm)  Pulse 80   Temp 98.9 F (37.2 C) (Oral)   Resp 18   Ht 5\' 2"  (1.575 m)   Wt 79.8 kg   LMP 10/20/2019 (Exact Date)   SpO2 98%   BMI 32.18 kg/m   Visual Acuity Right Eye Distance:   Left Eye Distance:   Bilateral Distance:    Right Eye Near:   Left Eye Near:    Bilateral Near:     Physical Exam Vitals and nursing note reviewed.  Constitutional:      General: She is not in acute distress.    Appearance: She is not ill-appearing.  HENT:     Mouth/Throat:     Mouth: Mucous membranes are moist.     Pharynx: No oropharyngeal exudate or posterior oropharyngeal erythema.  Cardiovascular:     Rate and Rhythm: Normal rate and regular rhythm.     Pulses: Normal pulses.     Heart sounds: Normal heart sounds. No murmur heard.  No friction rub.  Pulmonary:     Effort: Pulmonary effort is normal.     Breath sounds: Normal breath sounds.  Neurological:     Mental Status: She is alert.      UC Treatments / Results  Labs (all labs ordered are listed, but only abnormal  results are displayed) Labs Reviewed  SARS CORONAVIRUS 2 (TAT 6-24 HRS)    EKG   Radiology No results found.  Procedures Procedures (including critical care time)  Medications Ordered in UC Medications - No data to display  Initial Impression / Assessment and Plan / UC Course  I have reviewed the triage vital signs and the nursing notes.  Pertinent labs & imaging results that were available during my care of the patient were reviewed by me and considered in my medical decision making (see chart for details).    1.. Acute viral sinusitis: Supportive care with fluticasone nasal spray Mucinex 600 mg twice daily Saline nasal spray If symptom worsens patient is advised to return to urgent care to be reevaluated. There is no indication for antibiotics at this time. Patient is requesting antibiotics but I explained that supportive care is standard of care at this time. If his symptoms worsen in the coming week or so patient could return to the urgent care to be reevaluated and managed appropriately.  Final Clinical Impressions(s) / UC Diagnoses   Final diagnoses:  Acute viral sinusitis     Discharge Instructions     Saline nasal spray twice daily Increase oral fluid intake  Cepacol throat spray Return if symptoms worsens.    ED Prescriptions    Medication Sig Dispense Auth. Provider   fluticasone (FLONASE) 50 MCG/ACT nasal spray Place 1 spray into both nostrils daily. 16 g 12/20/2019, MD   guaiFENesin (MUCINEX) 600 MG 12 hr tablet Take 1 tablet (600 mg total) by mouth 2 (two) times daily for 10 days. 20 tablet Nelson Julson, Merrilee Jansky, MD     PDMP not reviewed this encounter.   Britta Mccreedy, MD 11/10/19 1328    01/10/20, MD 11/10/19 1332

## 2020-03-21 ENCOUNTER — Ambulatory Visit
Admission: EM | Admit: 2020-03-21 | Discharge: 2020-03-21 | Disposition: A | Discharge status: Home / Self Care | Payer: No Typology Code available for payment source | Attending: Internal Medicine | Admitting: Internal Medicine

## 2020-03-21 ENCOUNTER — Telehealth: Payer: Self-pay | Admitting: Internal Medicine

## 2020-03-21 ENCOUNTER — Other Ambulatory Visit: Payer: Self-pay

## 2020-03-21 DIAGNOSIS — Z20822 Contact with and (suspected) exposure to covid-19: Secondary | ICD-10-CM | POA: Diagnosis present

## 2020-03-21 DIAGNOSIS — J020 Streptococcal pharyngitis: Secondary | ICD-10-CM | POA: Diagnosis present

## 2020-03-21 LAB — GROUP A STREP BY PCR: Group A Strep by PCR: DETECTED — AB

## 2020-03-21 MED ORDER — PREDNISONE 20 MG PO TABS: 20.0000 mg | ORAL_TABLET | Freq: Every day | ORAL | 0 refills | Status: DC

## 2020-03-21 MED ORDER — PENICILLIN V POTASSIUM 500 MG PO TABS: 500.0000 mg | ORAL_TABLET | Freq: Two times a day (BID) | ORAL | 0 refills | Status: AC

## 2020-03-21 NOTE — ED Provider Notes (Signed)
[facility]    CSN: [id number] Arrival date & time: 03/21/20  1447      History   Chief Complaint Chief Complaint  Patient presents with  . Adenopathy    HPI Monique [name] is a 50 y.o. female who presents with onset of ST x 4 days. Her neck glands are swollen and sore. In the past couple of days has had intermittent HA and body aches.   Past Medical History:  Diagnosis Date  . Asthma   . Plantar fasciitis     There are no problems to display for this patient.   Past Surgical History:  Procedure Laterality Date  . CESAREAN SECTION    . NOSE SURGERY    . TONSILLECTOMY      OB History   No obstetric history on file.      Home Medications    Prior to Admission medications   Medication Sig Start Date End Date Taking? Authorizing Provider  fluticasone (FLONASE) 50 MCG/ACT nasal spray Place 1 spray into both nostrils daily. 11/08/19   Lamptey, Britta Mccreedy, MD  penicillin v potassium (VEETID) 500 MG tablet Take 1 tablet (500 mg total) by mouth in the morning and at bedtime for 10 days. 03/21/20 03/31/20  Rodriguez-Southworth, Nettie Elm, PA-C  predniSONE (DELTASONE) 20 MG tablet Take 1 tablet (20 mg total) by mouth daily with breakfast. 03/21/20   Rodriguez-Southworth, Nettie Elm, PA-C  albuterol (PROVENTIL HFA;VENTOLIN HFA) 108 (90 Base) MCG/ACT inhaler Inhale 1-2 puffs into the lungs every 6 (six) hours as needed for wheezing or shortness of breath. 09/28/15 10/25/19  Payton Mccallum, MD    Family History Family History  Problem Relation Age of Onset  . Cancer Mother   . Diabetes Mother   . Hypertension Mother   . Heart failure Father   . Diabetes Father   . Hyperlipidemia Father     Social History Social History   Tobacco Use  . Smoking status: Never Smoker  . Smokeless tobacco: Never Used  Vaping Use  . Vaping Use: Never used  Substance Use Topics  . Alcohol use: No  . Drug use: No     Allergies   Morphine   Review of Systems Review of  Systems  Constitutional: Negative for chills, diaphoresis, fatigue and fever.  HENT: Positive for sore throat. Negative for congestion, ear discharge, ear pain, postnasal drip and rhinorrhea.   Respiratory: Negative for cough.   Gastrointestinal: Negative for diarrhea, nausea and vomiting.  Musculoskeletal: Positive for myalgias and neck pain. Negative for gait problem.  Skin: Negative for rash.  Neurological: Positive for headaches.  Hematological: Positive for adenopathy.     Physical Exam Triage Vital Signs ED Triage Vitals  Enc Vitals Group     BP 03/21/20 1629 110/85     Pulse Rate 03/21/20 1629 72     Resp 03/21/20 1629 18     Temp 03/21/20 1629 98.7 F (37.1 C)     Temp Source 03/21/20 1629 Oral     SpO2 03/21/20 1629 98 %     Weight --      Height --      Head Circumference --      Peak Flow --      Pain Score 03/21/20 1626 5     Pain Loc --      Pain Edu? --      Excl. in GC? --    No data found.  Updated Vital Signs BP 110/85 (BP Location: Left Arm)  Pulse 72   Temp 98.7 F (37.1 C) (Oral)   Resp 18   LMP 03/05/2020   SpO2 98%   Visual Acuity Right Eye Distance:   Left Eye Distance:   Bilateral Distance:    Right Eye Near:   Left Eye Near:    Bilateral Near:     Physical Exam Vitals and nursing note reviewed.  Constitutional:      General: She is not in acute distress.    Appearance: She is not toxic-appearing.  HENT:     Head: Normocephalic.     Right Ear: Tympanic membrane, ear canal and external ear normal.     Left Ear: Tympanic membrane, ear canal and external ear normal.     Mouth/Throat:     Mouth: Mucous membranes are moist.     Pharynx: Posterior oropharyngeal erythema present. No oropharyngeal exudate.  Eyes:     General: No scleral icterus.    Conjunctiva/sclera: Conjunctivae normal.  Cardiovascular:     Rate and Rhythm: Normal rate and regular rhythm.  Pulmonary:     Effort: Pulmonary effort is normal.     Breath sounds:  Normal breath sounds.  Musculoskeletal:        General: Normal range of motion.     Cervical back: Neck supple. No rigidity.  Lymphadenopathy:     Cervical: Cervical adenopathy present.  Skin:    General: Skin is warm and dry.     Findings: No rash.  Neurological:     Mental Status: She is alert and oriented to person, place, and time.     Gait: Gait normal.  Psychiatric:        Mood and Affect: Mood normal.        Behavior: Behavior normal.        Thought Content: Thought content normal.        Judgment: Judgment normal.      UC Treatments / Results  Labs (all labs ordered are listed, but only abnormal results are displayed) Labs Reviewed  GROUP A STREP BY PCR - Abnormal; Notable for the following components:      Result Value   Group A Strep by PCR DETECTED (*)    All other components within normal limits  SARS CORONAVIRUS 2 (TAT 6-24 HRS)    EKG   Radiology No results found.  Procedures Procedures (including critical care time)  Medications Ordered in UC Medications - No data to display  Initial Impression / Assessment and Plan / UC Course  I have reviewed the triage vital signs and the nursing notes. Strep throat. I sent PNC and Prednisone as noted  to help decrease her swollen throat and nodes.  Final Clinical Impressions(s) / UC Diagnoses   Final diagnoses:  Close exposure to COVID-19 virus  Streptococcal sore throat     Discharge Instructions     If your Covid test ends up positive you may take the following supplements to help your immune system be stronger to fight this viral infection Take Quarcetin 500 mg three times a day x 7 days with Zinc 50 mg ones a day x 7 days. The quarcetin is an antiviral and anti-inflammatory supplement which helps open the zinc channels in the cell to absorb Zinc. Zinc helps decrease the virus load in your body. Take Melatonin 6-10 mg at bed time which also helps support your immune system.  Also make sure to take Vit D  5,000 IU per day with a fatty meal and Vit C  5000 mg a day until you are completely better. To prevent viral illnesses your vitamin D should be between 60-80. Stay on Vitamin D 2,000  and C  1000 mg the rest of the season.  Don't lay around, keep active and walk as much as you are able to to prevent worsening of your symptoms.  Follow up with your family Dr next week.  If you get short of breath and you are able to check  your oxygen with a pulse oxygen meter, if it gets to 92% or less, you need to go to the hospital to be admitted. If you dont have one, come back here and we will assess you.      ED Prescriptions    None     PDMP not reviewed this encounter.   Garey Ham, Cordelia Poche 03/21/20 1923

## 2020-03-21 NOTE — Discharge Instructions (Signed)
If your Covid test ends up positive you may take the following supplements to help your immune system be stronger to fight this viral infection Take Quarcetin 500 mg three times a day x 7 days with Zinc 50 mg ones a day x 7 days. The quarcetin is an antiviral and anti-inflammatory supplement which helps open the zinc channels in the cell to absorb Zinc. Zinc helps decrease the virus load in your body. Take Melatonin 6-10 mg at bed time which also helps support your immune system.  Also make sure to take Vit D 5,000 IU per day with a fatty meal and Vit C 5000 mg a day until you are completely better. To prevent viral illnesses your vitamin D should be between 60-80. Stay on Vitamin D 2,000  and C  1000 mg the rest of the season.  Don't lay around, keep active and walk as much as you are able to to prevent worsening of your symptoms.  Follow up with your family Dr next week.  If you get short of breath and you are able to check  your oxygen with a pulse oxygen meter, if it gets to 92% or less, you need to go to the hospital to be admitted. If you dont have one, come back here and we will assess you.   

## 2020-03-21 NOTE — ED Triage Notes (Signed)
Pt states that her daughter tested positive for Covid this past Saturday. Pt states that she has swollen lymph node, sore throat, HA, and body aches.

## 2020-03-21 NOTE — Telephone Encounter (Signed)
Her PCR strep test is positive and she was informed of this by Nathanial Rancher CMA. I sent PNC and prednisone.

## 2020-03-22 LAB — SARS CORONAVIRUS 2 (TAT 6-24 HRS): SARS Coronavirus 2: NEGATIVE

## 2020-03-28 ENCOUNTER — Telehealth (HOSPITAL_COMMUNITY): Payer: Self-pay | Admitting: Emergency Medicine

## 2020-03-28 MED ORDER — FLUCONAZOLE 150 MG PO TABS: 150.0000 mg | ORAL_TABLET | Freq: Once | ORAL | 0 refills | Status: AC

## 2020-03-28 NOTE — Telephone Encounter (Signed)
Patient states she is having a yeast infection due to current antibiotic use.  Will send in Diflucan, per protocol

## 2020-03-29 ENCOUNTER — Telehealth: Payer: Self-pay

## 2020-03-29 MED ORDER — FLUCONAZOLE 150 MG PO TABS: ORAL_TABLET | ORAL | 0 refills | Status: DC

## 2020-03-29 NOTE — Telephone Encounter (Signed)
Patient unable to get medication from pharmacy due to system being down, have sent in to [facility] per patient request.

## 2020-04-07 ENCOUNTER — Other Ambulatory Visit: Payer: Self-pay

## 2020-04-07 ENCOUNTER — Ambulatory Visit
Admission: EM | Admit: 2020-04-07 | Discharge: 2020-04-07 | Disposition: A | Discharge status: Home / Self Care | Payer: No Typology Code available for payment source | Attending: Family Medicine | Admitting: Family Medicine

## 2020-04-07 DIAGNOSIS — J45909 Unspecified asthma, uncomplicated: Secondary | ICD-10-CM | POA: Insufficient documentation

## 2020-04-07 DIAGNOSIS — Z20822 Contact with and (suspected) exposure to covid-19: Secondary | ICD-10-CM | POA: Insufficient documentation

## 2020-04-07 DIAGNOSIS — J020 Streptococcal pharyngitis: Secondary | ICD-10-CM | POA: Insufficient documentation

## 2020-04-07 LAB — MONONUCLEOSIS SCREEN: Mono Screen: NEGATIVE

## 2020-04-07 LAB — GROUP A STREP BY PCR: Group A Strep by PCR: DETECTED — AB

## 2020-04-07 MED ORDER — CEFDINIR 300 MG PO CAPS: 300.0000 mg | ORAL_CAPSULE | Freq: Two times a day (BID) | ORAL | 0 refills | Status: DC

## 2020-04-07 MED ORDER — FLUCONAZOLE 150 MG PO TABS: 150.0000 mg | ORAL_TABLET | Freq: Once | ORAL | 1 refills | Status: AC

## 2020-04-07 NOTE — ED Triage Notes (Signed)
Patient states that she had strep 2 weeks ago and feels like after taking the antibiotic 2 days later her lymph node started to swell again and her throat pain is back.

## 2020-04-07 NOTE — Discharge Instructions (Addendum)
Medication as prescribed.  Take care  Dr. Luisfernando Brightwell  

## 2020-04-08 LAB — SARS CORONAVIRUS 2 (TAT 6-24 HRS): SARS Coronavirus 2: NEGATIVE

## 2020-04-08 NOTE — ED Provider Notes (Signed)
[facility]    CSN: [id number] Arrival date & time: 04/07/20  1404      History   Chief Complaint Chief Complaint  Patient presents with  . Sore Throat   HPI  50 year old Monique [name] presents with the above complaint.  Patient recently treated for strep pharyngitis on 1/12.  Was treated with penicillin.  She states that she took the entire course.  Approximately 36 hours after she completed the course of antibiotics she developed recurrent symptoms.  She is had ongoing sore throat and also reports tender anterior cervical lymphadenopathy.  No fever.  Pain currently 4/10 in severity.  No known exacerbating factors.  She is unsure whether this is a recurrence or whether this is failure to resolve with penicillin.  No other complaints or concerns at this time.  Past Medical History:  Diagnosis Date  . Asthma   . Plantar fasciitis    Past Surgical History:  Procedure Laterality Date  . CESAREAN SECTION    . NOSE SURGERY    . TONSILLECTOMY      OB History   No obstetric history on file.      Home Medications    Prior to Admission medications   Medication Sig Start Date End Date Taking? Authorizing Provider  cefdinir (OMNICEF) 300 MG capsule Take 1 capsule (300 mg total) by mouth 2 (two) times daily. 04/07/20  Yes Yaminah Clayborn G, DO  albuterol (PROVENTIL HFA;VENTOLIN HFA) 108 (90 Base) MCG/ACT inhaler Inhale 1-2 puffs into the lungs every 6 (six) hours as needed for wheezing or shortness of breath. 09/28/15 10/25/19  Payton Mccallum, MD  fluticasone (FLONASE) 50 MCG/ACT nasal spray Place 1 spray into both nostrils daily. 11/08/19 04/07/20  Merrilee Jansky, MD    Family History Family History  Problem Relation Age of Onset  . Cancer Mother   . Diabetes Mother   . Hypertension Mother   . Heart failure Father   . Diabetes Father   . Hyperlipidemia Father     Social History Social History   Tobacco Use  . Smoking status: Never Smoker  . Smokeless tobacco: Never  Used  Vaping Use  . Vaping Use: Never used  Substance Use Topics  . Alcohol use: No  . Drug use: No     Allergies   Morphine   Review of Systems Review of Systems  HENT: Positive for sore throat.   Hematological: Positive for adenopathy.   Physical Exam Triage Vital Signs ED Triage Vitals  Enc Vitals Group     BP 04/07/20 1439 116/77     Pulse Rate 04/07/20 1439 74     Resp 04/07/20 1439 17     Temp 04/07/20 1439 98.9 F (37.2 C)     Temp Source 04/07/20 1439 Oral     SpO2 04/07/20 1439 99 %     Weight 04/07/20 1438 175 lb 14.8 oz (79.8 kg)     Height 04/07/20 1438 5\' 2"  (1.575 m)     Head Circumference --      Peak Flow --      Pain Score 04/07/20 1438 4     Pain Loc --      Pain Edu? --      Excl. in GC? --    Updated Vital Signs BP 116/77 (BP Location: Left Arm)   Pulse 74   Temp 98.9 F (37.2 C) (Oral)   Resp 17   Ht 5\' 2"  (1.575 m)   Wt 79.8 kg  SpO2 99%   BMI 32.18 kg/m   Visual Acuity Right Eye Distance:   Left Eye Distance:   Bilateral Distance:    Right Eye Near:   Left Eye Near:    Bilateral Near:     Physical Exam Vitals and nursing note reviewed.  Constitutional:      General: She is not in acute distress.    Appearance: Normal appearance. She is not ill-appearing.  HENT:     Head: Normocephalic and atraumatic.     Mouth/Throat:     Pharynx: Posterior oropharyngeal erythema present.  Eyes:     General:        Right eye: No discharge.        Left eye: No discharge.     Conjunctiva/sclera: Conjunctivae normal.  Cardiovascular:     Rate and Rhythm: Normal rate and regular rhythm.     Heart sounds: No murmur heard.   Pulmonary:     Effort: Pulmonary effort is normal.     Breath sounds: Normal breath sounds. No wheezing, rhonchi or rales.  Neurological:     Mental Status: She is alert.  Psychiatric:        Mood and Affect: Mood normal.        Behavior: Behavior normal.      UC Treatments / Results  Labs (all labs  ordered are listed, but only abnormal results are displayed) Labs Reviewed  GROUP A STREP BY PCR - Abnormal; Notable for the following components:      Result Value   Group A Strep by PCR DETECTED (*)    All other components within normal limits  SARS CORONAVIRUS 2 (TAT 6-24 HRS)  MONONUCLEOSIS SCREEN    EKG   Radiology No results found.  Procedures Procedures (including critical care time)  Medications Ordered in UC Medications - No data to display  Initial Impression / Assessment and Plan / UC Course  I have reviewed the triage vital signs and the nursing notes.  Pertinent labs & imaging results that were available during my care of the patient were reviewed by me and considered in my medical decision making (see chart for details).    50 year old Monique [name] presents with strep pharyngitis.  Unclear whether this is failure of first-line treatment or recurrence.  Placing on Omnicef.  Diflucan if needed.  Supportive care.  Final Clinical Impressions(s) / UC Diagnoses   Final diagnoses:  Strep pharyngitis     Discharge Instructions     Medication as prescribed. Take care  Dr. Adriana Simas     ED Prescriptions    Medication Sig Dispense Auth. Provider   cefdinir (OMNICEF) 300 MG capsule Take 1 capsule (300 mg total) by mouth 2 (two) times daily. 20 capsule Zackery Brine G, DO   fluconazole (DIFLUCAN) 150 MG tablet Take 1 tablet (150 mg total) by mouth once for 1 dose. Repeat dose in 72 hours. 2 tablet Tommie Sams, DO     PDMP not reviewed this encounter.   Tommie Sams, [place] 04/08/20 [phone]

## 2020-10-17 ENCOUNTER — Ambulatory Visit (INDEPENDENT_AMBULATORY_CARE_PROVIDER_SITE_OTHER): Payer: No Typology Code available for payment source

## 2020-10-17 ENCOUNTER — Ambulatory Visit
Admission: EM | Admit: 2020-10-17 | Discharge: 2020-10-17 | Disposition: A | Discharge status: Home / Self Care | Payer: No Typology Code available for payment source | Attending: Physician Assistant | Admitting: Physician Assistant

## 2020-10-17 ENCOUNTER — Encounter: Payer: Self-pay | Admitting: Emergency Medicine

## 2020-10-17 ENCOUNTER — Other Ambulatory Visit: Payer: Self-pay

## 2020-10-17 DIAGNOSIS — J019 Acute sinusitis, unspecified: Secondary | ICD-10-CM | POA: Diagnosis present

## 2020-10-17 DIAGNOSIS — R0981 Nasal congestion: Secondary | ICD-10-CM | POA: Insufficient documentation

## 2020-10-17 DIAGNOSIS — R059 Cough, unspecified: Secondary | ICD-10-CM | POA: Diagnosis not present

## 2020-10-17 DIAGNOSIS — U099 Post covid-19 condition, unspecified: Secondary | ICD-10-CM | POA: Diagnosis not present

## 2020-10-17 DIAGNOSIS — R509 Fever, unspecified: Secondary | ICD-10-CM | POA: Diagnosis present

## 2020-10-17 LAB — POCT RAPID STREP A: Streptococcus, Group A Screen (Direct): NEGATIVE

## 2020-10-17 MED ORDER — AMOXICILLIN-POT CLAVULANATE 875-125 MG PO TABS: 1.0000 | ORAL_TABLET | Freq: Two times a day (BID) | ORAL | 0 refills | Status: AC

## 2020-10-17 NOTE — Discharge Instructions (Addendum)
-  Chest xray is normal -Rapid strep test is negative.  Culture has been sent.  Someone will contact you with the results if positive in the next couple of days. -As we discussed, your symptoms could be continuing due to COVID.  COVID symptoms can persist for several weeks or months sometimes.  Continue with your decongestants. -Since you have a return of fever we are treating you for suspected/possible sinus infection at this time.  Complete full course.

## 2020-10-17 NOTE — ED Triage Notes (Signed)
Patient is 14 days from diagnosis of covid and continues to feel bad.  Patient complains of feeling feverish. Patient has a cough, complains of fluid in ears, sinus pressure and burning, headache

## 2020-10-17 NOTE — ED Provider Notes (Addendum)
[facility]    CSN: [id number] Arrival date & time: 10/17/20  1902      History   Chief Complaint Chief Complaint  Patient presents with   Cough    HPI Monique [name] is a 50 y.o. female presenting for 2-week history of cough and congestion as well as bilateral ear pain/pressure.  Patient diagnosed with COVID-19 2 weeks ago.  States that some of her symptoms have improved but she has had some increased sinus pressure and burning as well as headaches and sore throat over the past couple of days.  Patient concerned about the possibility of a sinus infection.  She reports feeling feverish over the past couple of days.  Temperature is currently 99.5 degrees but she says that she has taken Aleve 2 hours ago so she believes she had a higher fever than that.  She did take Paxlovid at onset of COVID diagnosis.  Patient states that she continues to take Sudafed and use Flonase without any significant improvement in her symptoms.  She does have history of asthma.  She has no other complaints or concerns.  HPI  Past Medical History:  Diagnosis Date   Asthma    Plantar fasciitis     There are no problems to display for this patient.   Past Surgical History:  Procedure Laterality Date   CESAREAN SECTION     NOSE SURGERY     TONSILLECTOMY      OB History   No obstetric history on file.      Home Medications    Prior to Admission medications   Medication Sig Start Date End Date Taking? Authorizing Provider  amoxicillin-clavulanate (AUGMENTIN) 875-125 MG tablet Take 1 tablet by mouth every 12 (twelve) hours for 7 days. 10/17/20 10/24/20 Yes Shirlee Latch, PA-C  cetirizine (ZYRTEC) 10 MG tablet Take 10 mg by mouth daily.   Yes [provider]  naproxen sodium (ALEVE) 220 MG tablet Take 220 mg by mouth.   Yes [provider]  pseudoephedrine-guaifenesin (MUCINEX D) 60-600 MG 12 hr tablet Take 1 tablet by mouth every 12 (twelve) hours.   Yes [provider]  albuterol (PROVENTIL HFA;VENTOLIN HFA) 108 (90 Base) MCG/ACT inhaler Inhale 1-2 puffs into the lungs every 6 (six) hours as needed for wheezing or shortness of breath. 09/28/15 10/25/19  Payton Mccallum, MD  fluticasone (FLONASE) 50 MCG/ACT nasal spray Place 1 spray into both nostrils daily. 11/08/19 04/07/20  LampteyBritta Mccreedy, MD    Family History Family History  Problem Relation Age of Onset   Cancer Mother    Diabetes Mother    Hypertension Mother    Heart failure Father    Diabetes Father    Hyperlipidemia Father     Social History Social History   Tobacco Use   Smoking status: Never   Smokeless tobacco: Never  Vaping Use   Vaping Use: Never used  Substance Use Topics   Alcohol use: No   Drug use: No     Allergies   Morphine   Review of Systems Review of Systems  Constitutional:  Positive for fatigue. Negative for chills, diaphoresis and fever.  HENT:  Positive for congestion, ear pain, rhinorrhea, sinus pressure and sore throat.   Respiratory:  Positive for cough. Negative for shortness of breath.   Cardiovascular:  Negative for chest pain.  Gastrointestinal:  Negative for abdominal pain, nausea and vomiting.  Musculoskeletal:  Negative for arthralgias and myalgias.  Skin:  Negative for rash.  Neurological:  Positive for headaches. Negative for weakness.  Hematological:  Negative for adenopathy.    Physical Exam Triage Vital Signs ED Triage Vitals  Enc Vitals Group     BP 10/17/20 1916 116/78     Pulse Rate 10/17/20 1916 87     Resp 10/17/20 1916 18     Temp 10/17/20 1916 99.5 F (37.5 C)     Temp Source 10/17/20 1916 Oral     SpO2 10/17/20 1916 97 %     Weight --      Height --      Head Circumference --      Peak Flow --      Pain Score 10/17/20 1911 5     Pain Loc --      Pain Edu? --      Excl. in GC? --    No data found.  Updated Vital Signs BP 116/78 (BP Location: Right Arm)   Pulse 87   Temp 99.5 F (37.5 C) (Oral)    Resp 18   LMP 10/04/2020   SpO2 97%     Physical Exam Vitals and nursing note reviewed.  Constitutional:      General: She is not in acute distress.    Appearance: Normal appearance. She is not ill-appearing or toxic-appearing.  HENT:     Head: Normocephalic and atraumatic.     Right Ear: Ear canal and external ear normal. A middle ear effusion is present.     Left Ear: Ear canal and external ear normal. A middle ear effusion is present.     Nose: Congestion and rhinorrhea present.     Right Sinus: Maxillary sinus tenderness present.     Left Sinus: Maxillary sinus tenderness present.     Mouth/Throat:     Mouth: Mucous membranes are moist.     Pharynx: Oropharynx is clear. Posterior oropharyngeal erythema present.  Eyes:     General: No scleral icterus.       Right eye: No discharge.        Left eye: No discharge.     Conjunctiva/sclera: Conjunctivae normal.  Cardiovascular:     Rate and Rhythm: Normal rate and regular rhythm.     Heart sounds: Normal heart sounds.  Pulmonary:     Effort: Pulmonary effort is normal. No respiratory distress.     Breath sounds: Normal breath sounds.  Musculoskeletal:     Cervical back: Neck supple.  Skin:    General: Skin is dry.  Neurological:     General: No focal deficit present.     Mental Status: She is alert. Mental status is at baseline.     Motor: No weakness.     Gait: Gait normal.  Psychiatric:        Mood and Affect: Mood normal.        Behavior: Behavior normal.        Thought Content: Thought content normal.     UC Treatments / Results  Labs (all labs ordered are listed, but only abnormal results are displayed) Labs Reviewed  CULTURE, GROUP A STREP [facility])  POCT RAPID STREP A, ED / UC    EKG   Radiology DG Chest 2 View  Result Date: 10/17/2020 CLINICAL DATA:  COVID 2 weeks prior, continued cough EXAM: CHEST - 2 VIEW COMPARISON:  01/22/2007 FINDINGS: No consolidation, features of edema, pneumothorax, or effusion.  Pulmonary vascularity is normally distributed. The cardiomediastinal contours are unremarkable. No acute osseous or soft tissue abnormality. IMPRESSION:  No acute cardiopulmonary abnormality. Electronically Signed   By: Kreg Shropshire M.D.   On: 10/17/2020 19:42    Procedures Procedures (including critical care time)  Medications Ordered in UC Medications - No data to display  Initial Impression / Assessment and Plan / UC Course  I have reviewed the triage vital signs and the nursing notes.  Pertinent labs & imaging results that were available during my care of the patient were reviewed by me and considered in my medical decision making (see chart for details).   50 y/o female with history of COVID 19 diagnosed 2 weeks ago presenting for continued symptoms with return of fever since yesterday. Also admits sore throat x 2 days and increase sinus pressure. No SOB, chest pain.   VSS and patient overall well appearing.  Exam is significant for effusion and Bilateral TMs and nasal congestion as well as maxillary sinus tenderness bilaterally.  Chest is clear to auscultation heart regular rate and rhythm.  CXR ordered to assess for possible pneumonia given return of fever and continued COVID symptoms.  Rapid strep test obtained.  Negative.  Culture sent.  Reviewed patient, possible sinus infection given her clinical presentation.  Sent in Augmentin.  We also discussed the possibility that this could all still be symptoms due to COVID.  Advised supportive care.  Advised to continue with decongestants and Flonase.  Increase rest and fluids.  Consider use of humidifier.  Follow-up with PCP if not improving over the next 7 to 10 days or for symptoms that are worsening.  To the ED for any severely worsening symptoms.   Final Clinical Impressions(s) / UC Diagnoses   Final diagnoses:  Acute sinusitis, recurrence not specified, unspecified location  Nasal congestion  Fever, unspecified      Discharge Instructions      -Chest xray is normal -Rapid strep test is negative.  Culture has been sent.  Someone will contact you with the results if positive in the next couple of days. -As we discussed, your symptoms could be continuing due to COVID.  COVID symptoms can persist for several weeks or months sometimes.  Continue with your decongestants. -Since you have a return of fever we are treating you for suspected/possible sinus infection at this time.  Complete full course.     ED Prescriptions     Medication Sig Dispense Auth. Provider   amoxicillin-clavulanate (AUGMENTIN) 875-125 MG tablet Take 1 tablet by mouth every 12 (twelve) hours for 7 days. 14 tablet Gareth Morgan      PDMP not reviewed this encounter.   Shirlee Latch, PA-C 10/17/20 2007    Eusebio Friendly B, PA-C 10/17/20 2009

## 2020-10-21 ENCOUNTER — Telehealth: Payer: Self-pay | Admitting: Family Medicine

## 2020-10-21 LAB — CULTURE, GROUP A STREP (THRC)

## 2020-10-21 MED ORDER — FLUCONAZOLE 150 MG PO TABS: 150.0000 mg | ORAL_TABLET | Freq: Once | ORAL | 0 refills | Status: AC

## 2020-10-21 NOTE — Telephone Encounter (Signed)
Rx sent for diflucan per patient request in setting of antibiotic therapy.  Everlene Other DO [facility]

## 2021-05-12 NOTE — ED Provider Notes (Signed)
 [facility] EMERGENCY DEPT  ED Provider Note History   Chief Complaint  Patient presents with  . Back Pain   History of Present Illness  Patient Info:    History provided by:  Patient and medical records   Language interpreter used: No   51 year old patient presents to the emergency department for back pain x6 days.  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  Denies a history of chronic back pain and no known injuries other.  No retention or loss control of bowels or bladder.  Patient has been taking Flexeril Tylenol ibuprofen  and oxycodone with very little relief.  She has been constipated since starting the oxycodone.  Minimal relief with Dilaudid given in triage.  No saddle anesthesia.  No fevers or chills.  No history of IV drug use.  Denies any other issues or concerns at this time.  Past Medical History:  Diagnosis Date  . Abnormal uterine bleeding    very heavy periods  . Adjustment disorder with anxious mood   . Chronic low back pain   . Fibromyalgia   . Insomnia   . PONV (postoperative nausea and vomiting)    Past Surgical History:  Procedure Laterality Date  . CESAREAN SECTION  2007  . TUBAL LIGATION  2007  . SLING FOR STRESS INCONTINENCE N/A 02/20/2020   Procedure: SLING OPERATION FOR STRESS INCONTINENCE;  Surgeon: Lee Dorthea Kay, MD;  Location: [facility] OR;  Service: Gynecology;  Laterality: N/A;  . CYSTOURETHROSCOPY N/A 02/20/2020   Procedure: CYSTOURETHROSCOPY;  Surgeon: Lee Dorthea Kay, MD;  Location: [facility] OR;  Service: Gynecology;  Laterality: N/A;  . RHINOPLASTY     Family History  Problem Relation Age of Onset  . Heart disease Mother   . Diabetes Mother   . Breast cancer Mother   . Heart disease Father   . Diabetes Father   . High blood pressure (Hypertension) Father   . Breast cancer Sister   . High blood pressure (Hypertension) Sister   . Diabetes Sister   . Diabetes Brother     Social History   Socioeconomic History  . Marital status: Married  Tobacco Use  . Smoking status: Never  . Smokeless tobacco: Never  Vaping Use  . Vaping Use: Never used  Substance and Sexual Activity  . Alcohol use: No  . Drug use: No  . Sexual activity: Not Currently    Partners: Male   Review of Systems  Constitutional: Negative for fever.  Gastrointestinal:       No retention or loss of control of bowels  Genitourinary: Negative for dysuria.       No retention or loss control bladder  Musculoskeletal: Positive for back pain.  Skin: Negative for rash.  Neurological: Negative for weakness and numbness.  Hematological: Does not bruise/bleed easily.    Physical Exam  BP 124/66 (BP Location: Right upper arm, Patient Position: Sitting)   Pulse 79   Temp 36.2 C (97.2 F) (Tympanic)   Resp 18   Wt 83.9 kg (185 lb)   LMP 05/01/2021 (Approximate)   SpO2 97%   BMI 33.83 kg/m  Physical Exam Exam: BP 124/66 (BP Location: Right upper arm, Patient Position: Sitting)   Pulse 79   Temp 36.2 C (97.2 F) (Tympanic)   Resp 18   Wt 83.9 kg (185 lb)   LMP 05/01/2021 (Approximate)   SpO2 97%   BMI 33.83 kg/m  GENERAL:  VS reviewed.  Alert The patient appears in no distress. Head: Normocephalic no obvious trauma Pupils: round reactive ENT: The neck is supple  Airway is patent.  Pt handling secretions. PULMONARY: Currently in no acute respiratory distress.  Normal, non labored respirations. CIRCULATORY: RRR. NEUROLOGIC: AO. Back exam: Patient is no significant kyphosis or lordosis or scoliosis.  No obvious contractures or deformities of the lower extremities.  Muscle mass is equal bilateral.  Normal gait.  Range of motion is limited to pain.  No pain over bony surface.  Patient points to and is tender bilateral paravertebrals.  Good strength and range of motion in-knee lift against resistance, active and passive abduction and adduction of the legs, flexion and extension at the  hips knees and ankles.  Strong dorsal and plantar flexion. MUSCULOSKELETAL:   No edema.  Good distal color. SKIN:  No obvious rash.  Warm.  Dry to touch. PSYCHIATRIC: Mood and affect is normal.  Procedures  Procedures   Medical Decision Making  MDM 51 year old patient presents to the emergency department complaining of low back pain x6 days  Differential diagnosis considered includes but is not limited to: Musculoskeletal sprain strain of the LS spine.  I believe it is less likely patient has acute HNP cord compression or infection.  My impression and reviewed with the patient's x-ray is no acute fracture or dislocation.  Since the patient's symptoms have been refractory to oxycodone Dilaudid Flexeril and ibuprofen  we will give a trial of diclofenac, tizanidine and Lidoderm patches.  Patient will be given referral to primary care and spine service per request.    Pt stable condition without obvious distress or indication for additional imaging, treatment, consultation or admission.  Appears stable for d/c at this time.  Significant findings were reviewed and explained to the patient.  Impression given and explained.  I verbally went over discharge including instructions for follow up and S&Sx for return to the ED.  A printed form of these instructions is included in the AVS.  All questions answered in language this patient would be sure to understand.  Pt verbalized understanding, denies any questions or concerns that have not been addressed and verbalizes intentions of compliance.   Red flags for return and the patient was asked to return to the ED immediately for any new or concerning symptoms and if they get worse.    Potential side affects and adverse reactions of all medications prescribed were discussed with the patient who verbalized understanding.  This note was written using Dragon dictation.  There may be spelling and/or grammatical errors and phrases that were mistakenly  interpreted by the software and missed in my review.      ED Course as of 05/12/21 1750  Sun May 12, 2021  1727 C Reactive Protein: 0.33 [JS]  1727 Sedimentation Rate-Automated: 5 [JS]  1727 WBC(!): 10.1 [JS]    ED Course User Index [JS] Norville Marty Graff, PA      Medications Administered in the Emergency Department   HYDROmorphone (DILAUDID) 1 mg/mL inj syringe 1 mg (1 mg Intravenous Given 05/12/21 1343)   ED Clinical Impression  1. Acute midline low back pain without sciatica   2. Strain of lumbar region, initial encounter      ED Disposition  Discharge           Norville Marty Graff, [place] 05/12/21 1750

## 2022-04-22 IMAGING — CR DG CHEST 2V
2 series · 2 of 2 positions shown · non-contrast
Comparison: 01/22/2007

CLINICAL DATA: COVID 2 weeks prior, continued cough

EXAM:
CHEST - 2 VIEW

[chest pa]
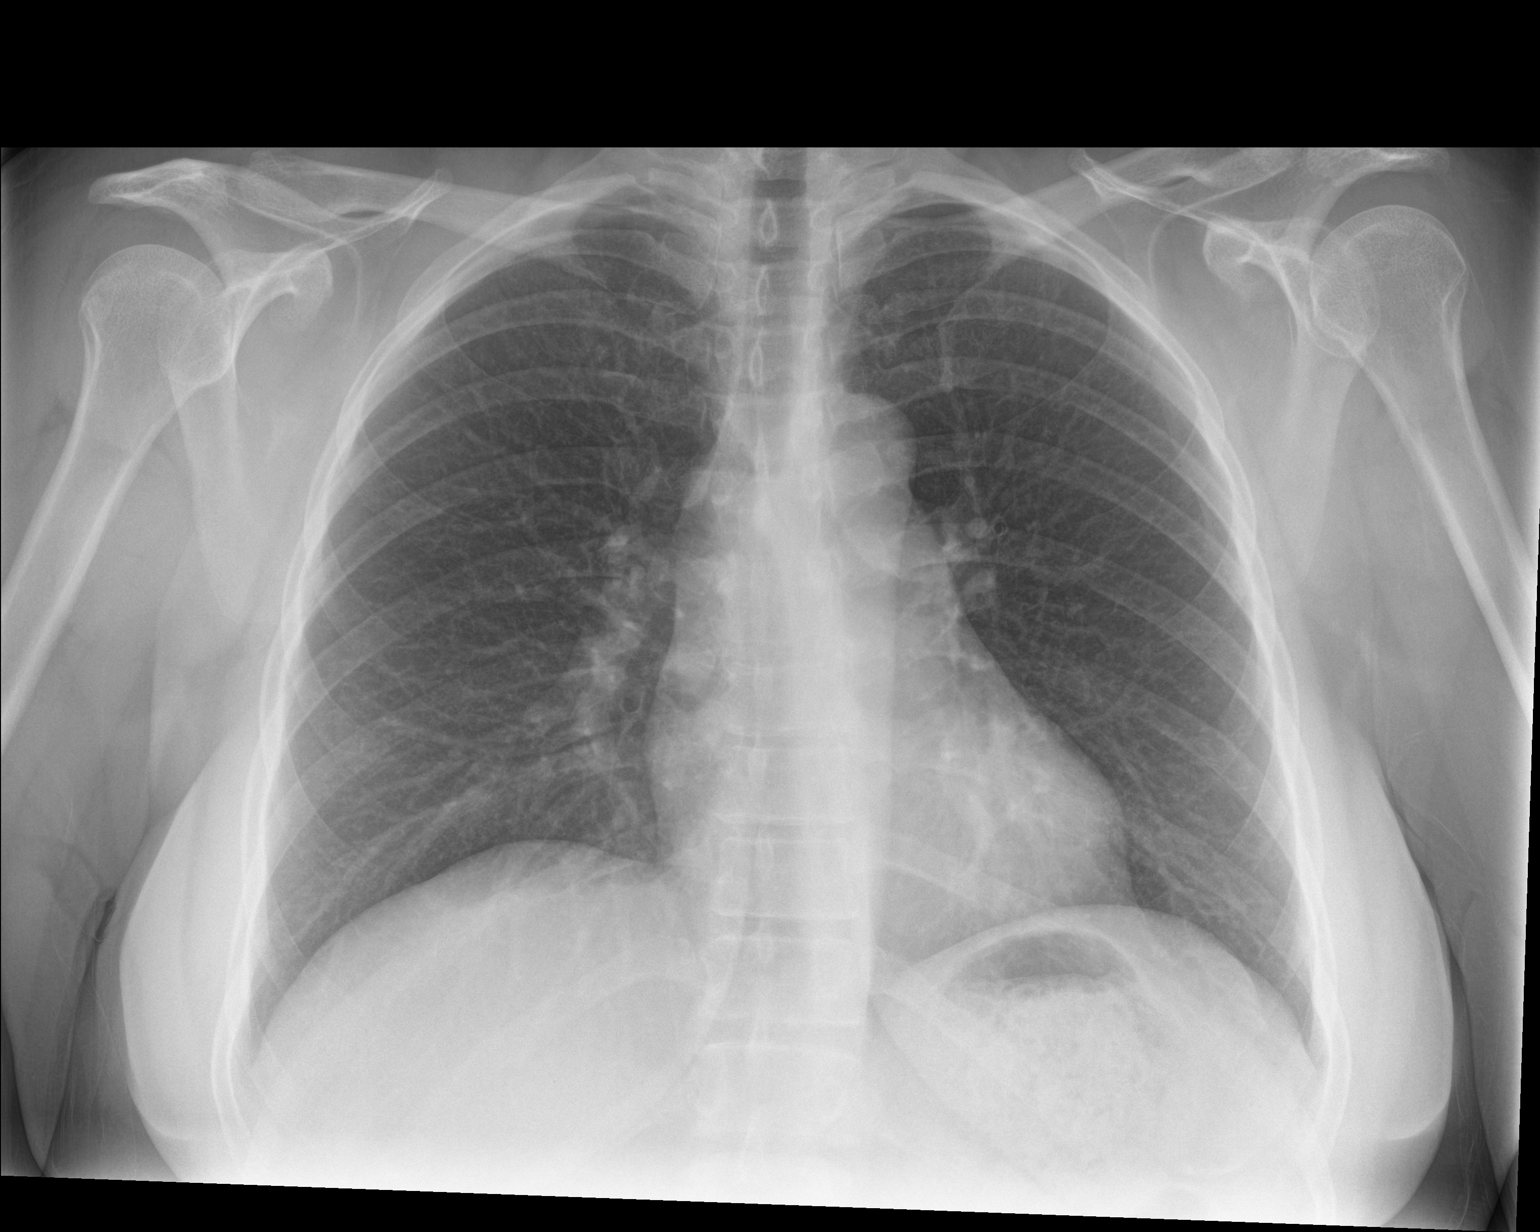

[chest lat]
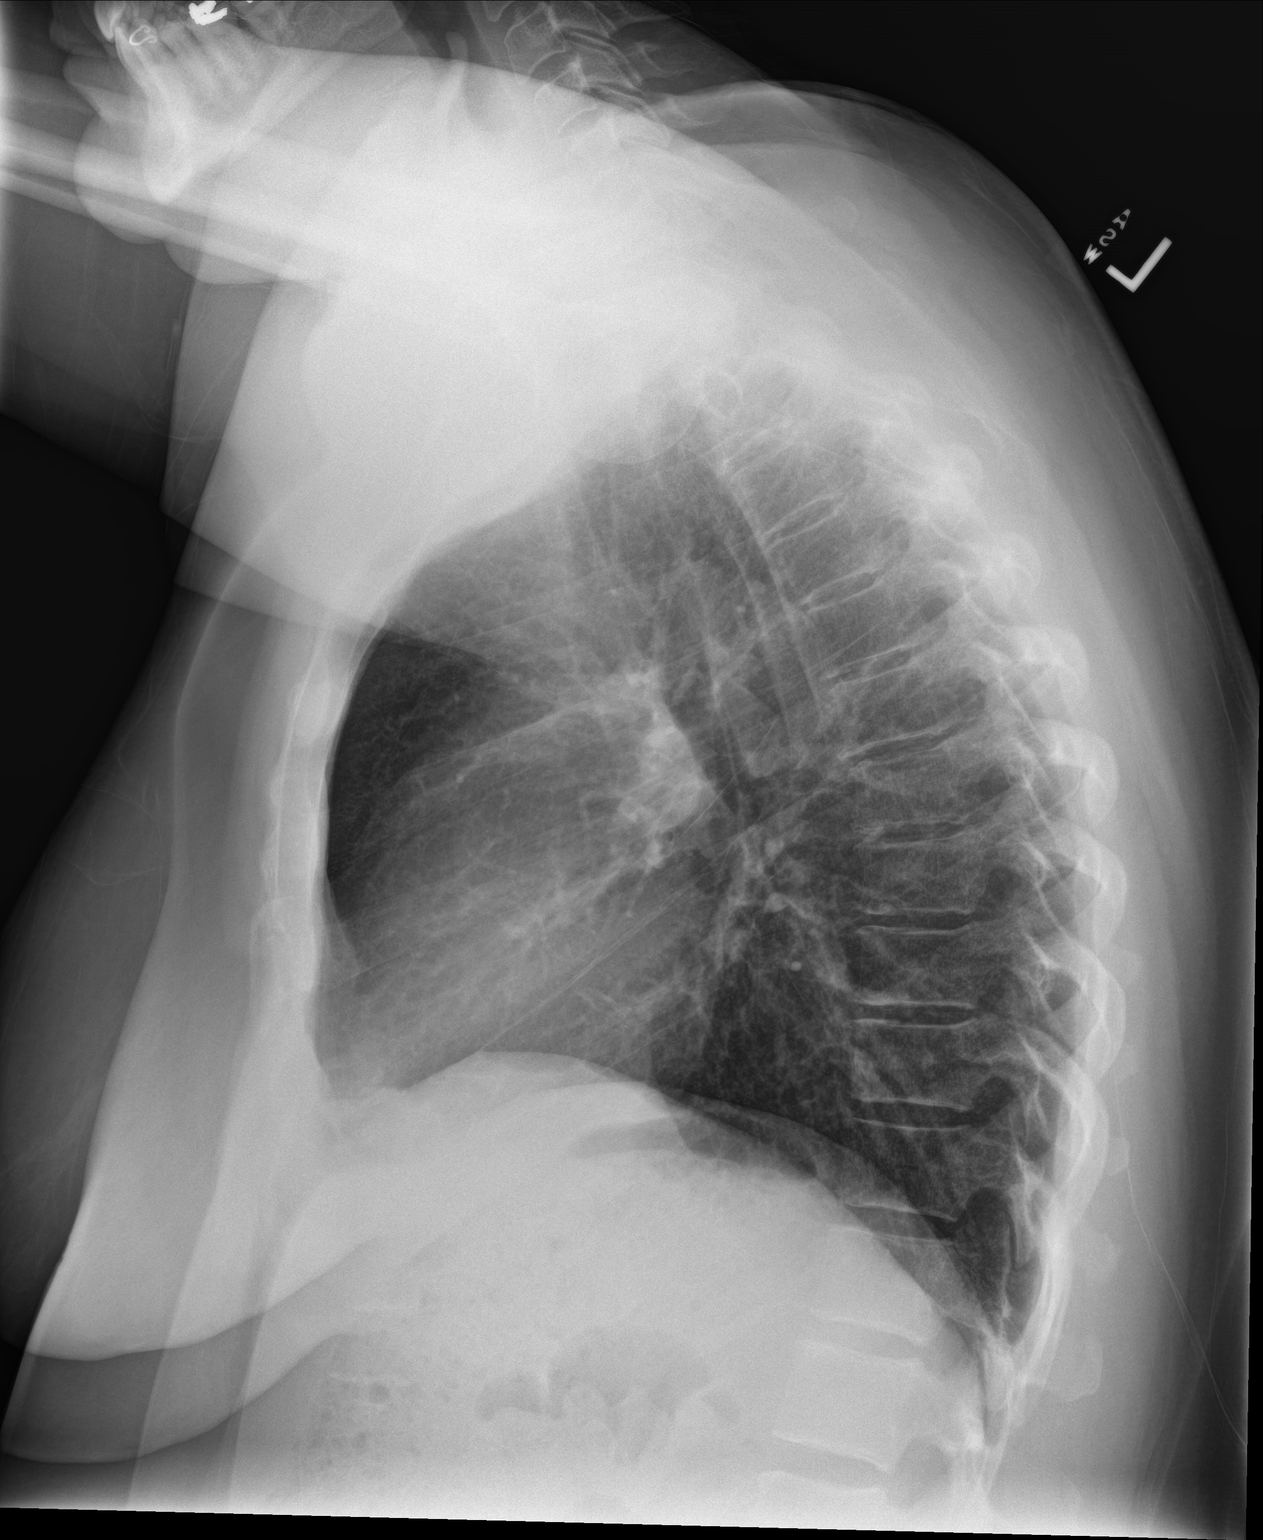

[2 of 2 positions shown; findings below may reference images not displayed]

FINDINGS: No consolidation, features of edema, pneumothorax, or effusion.
Pulmonary vascularity is normally distributed. The cardiomediastinal
contours are unremarkable. No acute osseous or soft tissue
abnormality.
IMPRESSION: No acute cardiopulmonary abnormality.

## 2022-10-07 ENCOUNTER — Ambulatory Visit
Admission: EM | Admit: 2022-10-07 | Discharge: 2022-10-07 | Disposition: A | Discharge status: Home / Self Care | Payer: No Typology Code available for payment source | Attending: Physician Assistant | Admitting: Physician Assistant

## 2022-10-07 DIAGNOSIS — R0981 Nasal congestion: Secondary | ICD-10-CM | POA: Diagnosis present

## 2022-10-07 DIAGNOSIS — J069 Acute upper respiratory infection, unspecified: Secondary | ICD-10-CM | POA: Diagnosis not present

## 2022-10-07 DIAGNOSIS — J029 Acute pharyngitis, unspecified: Secondary | ICD-10-CM | POA: Insufficient documentation

## 2022-10-07 DIAGNOSIS — Z1152 Encounter for screening for COVID-19: Secondary | ICD-10-CM | POA: Insufficient documentation

## 2022-10-07 LAB — SARS CORONAVIRUS 2 BY RT PCR: SARS Coronavirus 2 by RT PCR: NEGATIVE

## 2022-10-07 LAB — GROUP A STREP BY PCR: Group A Strep by PCR: NOT DETECTED

## 2022-10-07 NOTE — Discharge Instructions (Signed)
-  Negative strep and COVID -Start Claritin and Flonase  URI/COLD SYMPTOMS: Your exam today is consistent with a viral illness. Antibiotics are not indicated at this time. Use medications as directed, including cough syrup, nasal saline, and decongestants. Your symptoms should improve over the next few days and resolve within 7-10 days. Increase rest and fluids. F/u if symptoms worsen or predominate such as sore throat, ear pain, productive cough, shortness of breath, or if you develop high fevers or worsening fatigue over the next several days.

## 2022-10-07 NOTE — ED Provider Notes (Signed)
[facility]    CSN: [id number] Arrival date & time: 10/07/22  1525      History   Chief Complaint Chief Complaint  Patient presents with   Otalgia        Sore Throat   Headache   Nasal Congestion    HPI Monique [name] is a 52 y.o. female presenting for 2 to 3-day history of fatigue, sore throat, cough, congestion, fatigue, headaches and bodyaches.  Also reports left ear pain for the past few days.  Tmax 99.6 degrees.  Denies sinus pain, chest pain, shortness of breath, abdominal pain.  Reports she went to the ER 5 days ago for abdominal cramping and diarrhea which was apparently attributed to starting metformin.  She reports abdominal cramping and diarrhea have resolved.  Reports that her daughter is currently sick with abdominal cramping, nausea/vomiting and diarrhea.  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  She is concerned about the possibility of COVID. Not currently taking OTC meds for symptoms.   HPI  Past Medical History:  Diagnosis Date   Asthma    Plantar fasciitis     There are no problems to display for this patient.   Past Surgical History:  Procedure Laterality Date   CESAREAN SECTION     NOSE SURGERY     TONSILLECTOMY      OB History   No obstetric history on file.      Home Medications    Prior to Admission medications   Medication Sig Start Date End Date Taking? Authorizing Provider  ALPRAZolam Prudy Feeler) 0.25 MG tablet Take by mouth. 06/25/22  Yes [provider]  gabapentin (NEURONTIN) 300 MG capsule Take by mouth. 06/25/22  Yes [provider]  cetirizine (ZYRTEC) 10 MG tablet Take 10 mg by mouth daily.    [provider]  naproxen sodium (ALEVE) 220 MG tablet Take 220 mg by mouth.    [provider]  pseudoephedrine-guaifenesin (MUCINEX D) 60-600 MG 12 hr tablet Take 1 tablet by mouth every 12 (twelve) hours.     [provider]  albuterol (PROVENTIL HFA;VENTOLIN HFA) 108 (90 Base) MCG/ACT inhaler Inhale 1-2 puffs into the lungs every 6 (six) hours as needed for wheezing or shortness of breath. 09/28/15 10/25/19  Payton Mccallum, MD  fluticasone (FLONASE) 50 MCG/ACT nasal spray Place 1 spray into both nostrils daily. 11/08/19 04/07/20  LampteyBritta Mccreedy, MD    Family History Family History  Problem Relation Age of Onset   Cancer Mother    Diabetes Mother    Hypertension Mother    Heart failure Father    Diabetes Father    Hyperlipidemia Father     Social History Social History   Tobacco Use   Smoking status: Never   Smokeless tobacco: Never  Vaping Use   Vaping status: Never Used  Substance Use Topics   Alcohol use: No   Drug use: No     Allergies   Morphine   Review of Systems Review of Systems  Constitutional:  Positive for fatigue. Negative for chills, diaphoresis and fever.  HENT:  Positive for congestion, ear pain, rhinorrhea and sore throat. Negative for sinus pressure and sinus pain.   Respiratory:  Positive for cough. Negative for shortness of breath.   Cardiovascular:  Negative for chest pain.  Gastrointestinal:  Negative for abdominal pain, nausea and vomiting.  Musculoskeletal:  Positive for myalgias.  Skin:  Negative for  rash.  Neurological:  Positive for headaches. Negative for weakness.  Hematological:  Negative for adenopathy.     Physical Exam Triage Vital Signs ED Triage Vitals  Encounter Vitals Group     BP      Systolic BP Percentile      Diastolic BP Percentile      Pulse      Resp      Temp      Temp src      SpO2      Weight      Height      Head Circumference      Peak Flow      Pain Score      Pain Loc      Pain Education      Exclude from Growth Chart    No data found.  Updated Vital Signs BP 117/79 (BP Location: Left Arm)   Pulse 86   Temp 99.6 F (37.6 C) (Oral)   Resp 16   Ht 5\' 3"  (1.6 m)   Wt 190 lb (86.2 kg)    SpO2 96%   BMI 33.66 kg/m    Physical Exam Vitals and nursing note reviewed.  Constitutional:      General: She is not in acute distress.    Appearance: Normal appearance. She is not ill-appearing or toxic-appearing.  HENT:     Head: Normocephalic and atraumatic.     Right Ear: Tympanic membrane, ear canal and external ear normal.     Left Ear: Ear canal and external ear normal. A middle ear effusion is present.     Nose: Congestion present.     Mouth/Throat:     Mouth: Mucous membranes are moist.     Pharynx: Oropharynx is clear. Posterior oropharyngeal erythema present.  Eyes:     General: No scleral icterus.       Right eye: No discharge.        Left eye: No discharge.     Conjunctiva/sclera: Conjunctivae normal.  Cardiovascular:     Rate and Rhythm: Normal rate and regular rhythm.     Heart sounds: Normal heart sounds.  Pulmonary:     Effort: Pulmonary effort is normal. No respiratory distress.     Breath sounds: Normal breath sounds. No wheezing, rhonchi or rales.  Musculoskeletal:     Cervical back: Neck supple.  Skin:    General: Skin is dry.  Neurological:     General: No focal deficit present.     Mental Status: She is alert. Mental status is at baseline.     Motor: No weakness.     Gait: Gait normal.  Psychiatric:        Mood and Affect: Mood normal.        Behavior: Behavior normal.        Thought Content: Thought content normal.      UC Treatments / Results  Labs (all labs ordered are listed, but only abnormal results are displayed) Labs Reviewed  SARS CORONAVIRUS 2 BY RT PCR  GROUP A STREP BY PCR    EKG   Radiology No results found.  Procedures Procedures (including critical care time)  Medications Ordered in UC Medications - No data to display  Initial Impression / Assessment and Plan / UC Course  I have reviewed the triage vital signs and the nursing notes.  Pertinent labs & imaging results that were available during my care of the  patient were reviewed by me and considered in  my medical decision making (see chart for details).   52 year old female presents for fatigue, cough, congestion, sore throat, headaches, body aches and left ear pain x 2 to 3 days.  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  Also reports her daughter is sick.  Vitals are normal and stable.  Patient is overall well-appearing.  On exam she has clear effusion of left TM, nasal congestion and mild posterior pharyngeal erythema.  Chest clear to auscultation.  COVID and strep testing negative.  Reviewed results with patient.  Viral illness vs allergies. Patient reports chronic "sinus issues."  Supportive care encouraged with increased rest and fluids.  Advised antihistamines and Flonase. Reviewed return precautions.   Final Clinical Impressions(s) / UC Diagnoses   Final diagnoses:  Viral upper respiratory tract infection  Sore throat  Nasal congestion     Discharge Instructions      -Negative strep and COVID -Start Claritin and Flonase  URI/COLD SYMPTOMS: Your exam today is consistent with a viral illness. Antibiotics are not indicated at this time. Use medications as directed, including cough syrup, nasal saline, and decongestants. Your symptoms should improve over the next few days and resolve within 7-10 days. Increase rest and fluids. F/u if symptoms worsen or predominate such as sore throat, ear pain, productive cough, shortness of breath, or if you develop high fevers or worsening fatigue over the next several days.       ED Prescriptions   None    PDMP not reviewed this encounter.   Shirlee Latch, PA-C 10/07/22 [phone]

## 2022-10-07 NOTE — ED Triage Notes (Signed)
Pt c/o ear pain,sore throat,congestion,fatigue & HA x2 days. States was exposed to covid.

## 2023-02-12 ENCOUNTER — Ambulatory Visit
Admission: EM | Admit: 2023-02-12 | Discharge: 2023-02-12 | Disposition: A | Discharge status: Home / Self Care | Payer: No Typology Code available for payment source

## 2023-02-12 DIAGNOSIS — I809 Phlebitis and thrombophlebitis of unspecified site: Secondary | ICD-10-CM

## 2023-02-12 DIAGNOSIS — M79601 Pain in right arm: Secondary | ICD-10-CM | POA: Diagnosis not present

## 2023-02-12 DIAGNOSIS — M7989 Other specified soft tissue disorders: Secondary | ICD-10-CM | POA: Diagnosis not present

## 2023-02-12 DIAGNOSIS — R609 Edema, unspecified: Secondary | ICD-10-CM

## 2023-02-12 NOTE — ED Provider Notes (Signed)
[facility]    CSN: [id number] Arrival date & time: 02/12/23  1759      History   Chief Complaint Chief Complaint  Patient presents with   Wound Check         HPI DAGNEY SAVASTANO is a 52 y.o. female.   52 year old female, Shamonica Goulding, presents to urgent care for evaluation of right inner forearm.  Patient states that she was evaluated in the ER at the [place] on 02/05/2023 and has since then developed bruising redness swelling pain to right inner forearm.  States she generally does not feel well, temp 99 in office.  Pmh: Abnormal uterine bleeding, asthma, plantar fasciitis  The history is provided by the patient. No language interpreter was used.    Past Medical History:  Diagnosis Date   Asthma    Plantar fasciitis     Patient Active Problem List   Diagnosis Date Noted   Right arm pain 02/12/2023   Phlebitis 02/12/2023   Swelling 02/12/2023   Swelling of arm 02/12/2023    Past Surgical History:  Procedure Laterality Date   CESAREAN SECTION     NOSE SURGERY     TONSILLECTOMY      OB History   No obstetric history on file.      Home Medications    Prior to Admission medications   Medication Sig Start Date End Date Taking? Authorizing Provider  ALPRAZolam Prudy Feeler) 0.25 MG tablet Take by mouth. 06/25/22   [provider]  cetirizine (ZYRTEC) 10 MG tablet Take 10 mg by mouth daily.    [provider]  gabapentin (NEURONTIN) 300 MG capsule Take by mouth. 06/25/22   [provider]  naproxen sodium (ALEVE) 220 MG tablet Take 220 mg by mouth.    [provider]  pseudoephedrine-guaifenesin (MUCINEX D) 60-600 MG 12 hr tablet Take 1 tablet by mouth every 12 (twelve) hours.    [provider]  albuterol (PROVENTIL HFA;VENTOLIN HFA) 108 (90 Base) MCG/ACT inhaler Inhale 1-2 puffs into the lungs every 6 (six) hours as needed for wheezing or shortness of breath. 09/28/15 10/25/19  Payton Mccallum, MD   fluticasone (FLONASE) 50 MCG/ACT nasal spray Place 1 spray into both nostrils daily. 11/08/19 04/07/20  LampteyBritta Mccreedy, MD    Family History Family History  Problem Relation Age of Onset   Cancer Mother    Diabetes Mother    Hypertension Mother    Heart failure Father    Diabetes Father    Hyperlipidemia Father     Social History Social History   Tobacco Use   Smoking status: Never   Smokeless tobacco: Never  Vaping Use   Vaping status: Never Used  Substance Use Topics   Alcohol use: No   Drug use: No     Allergies   Morphine   Review of Systems Review of Systems  Constitutional:  Positive for fever.  Skin:  Positive for color change and wound.  All other systems reviewed and are negative.    Physical Exam Triage Vital Signs ED Triage Vitals [02/12/23 1817]  Encounter Vitals Group     BP      Systolic BP Percentile      Diastolic BP Percentile      Pulse      Resp      Temp      Temp src      SpO2      Weight 190 lb (86.2 kg)     Height  5\' 2"  (1.575 m)     Head Circumference      Peak Flow      Pain Score 7     Pain Loc      Pain Education      Exclude from Growth Chart    No data found.  Updated Vital Signs BP 137/81 (BP Location: Left Arm)   Pulse 78   Temp 99 F (37.2 C) (Oral)   Ht 5\' 2"  (1.575 m)   Wt 190 lb (86.2 kg)   LMP 02/10/2023   SpO2 93%   BMI 34.75 kg/m   Visual Acuity Right Eye Distance:   Left Eye Distance:   Bilateral Distance:    Right Eye Near:   Left Eye Near:    Bilateral Near:     Physical Exam Vitals and nursing note reviewed.  Constitutional:      Appearance: She is well-developed and well-groomed.  HENT:     Head: Normocephalic.  Cardiovascular:     Rate and Rhythm: Normal rate and regular rhythm.     Pulses: Normal pulses.          Radial pulses are 2+ on the right side.  Skin:    General: Skin is warm.     Capillary Refill: Capillary refill takes less than 2 seconds.     Findings:  Ecchymosis, erythema and wound present.       Neurological:     General: No focal deficit present.     Mental Status: She is alert and oriented to person, place, and time.     GCS: GCS eye subscore is 4. GCS verbal subscore is 5. GCS motor subscore is 6.     Cranial Nerves: No cranial nerve deficit.     Sensory: No sensory deficit.  Psychiatric:        Behavior: Behavior is cooperative.      UC Treatments / Results  Labs (all labs ordered are listed, but only abnormal results are displayed) Labs Reviewed - No data to display  EKG   Radiology No results found.  Procedures Procedures (including critical care time)  Medications Ordered in UC Medications - No data to display  Initial Impression / Assessment and Plan / UC Course  I have reviewed the triage vital signs and the nursing notes.  Pertinent labs & imaging results that were available during my care of the patient were reviewed by me and considered in my medical decision making (see chart for details).  Clinical Course as of 02/12/23 1840  Thu Feb 12, 2023  1825 No [place] capabilities at this location,referred to ER for r/o DVT, phlebitis, cellulitis.  Patient verbalized understanding to this provider will go to [facility] ER for further evaluation. [JD]    Clinical Course User Index [JD] Cira Deyoe, Para March, NP    Ddx: Phlebitis,cellulitis, DVT Final Clinical Impressions(s) / UC Diagnoses   Final diagnoses:  Right arm pain  Phlebitis  Swelling of arm     Discharge Instructions      Please go to ER for further evaluation,r/o blood clot in right arm.      ED Prescriptions   None    PDMP not reviewed this encounter.   Clancy Gourd, NP 02/12/23 1840

## 2023-02-12 NOTE — ED Notes (Addendum)
Patient is being discharged from the Urgent Care and sent to the Emergency Department via Personal Vehicle . Per Para March, NP, patient is in need of higher level of care due to Possible Clot  in right arm. Patient is aware and verbalizes understanding of plan of care.  Vitals:   02/12/23 1818  BP: 137/81  Pulse: 78  Temp: 99 F (37.2 C)  SpO2: 93%

## 2023-02-12 NOTE — Discharge Instructions (Signed)
Please go to ER for further evaluation,r/o blood clot in right arm.

## 2023-02-12 NOTE — ED Triage Notes (Signed)
Pt c/o possible infection from blood draw on 11.28.24  Pt states that she had a "rough" blood draw and is now having lateral arm swelling, redness, and bruising along the side of her arm.

## 2023-12-08 ENCOUNTER — Ambulatory Visit
Admission: EM | Admit: 2023-12-08 | Discharge: 2023-12-08 | Disposition: A | Discharge status: Home / Self Care | Attending: Emergency Medicine | Admitting: Emergency Medicine

## 2023-12-08 DIAGNOSIS — L237 Allergic contact dermatitis due to plants, except food: Secondary | ICD-10-CM

## 2023-12-08 MED ORDER — TRIAMCINOLONE ACETONIDE 0.1 % EX OINT: 1.0000 | TOPICAL_OINTMENT | Freq: Two times a day (BID) | CUTANEOUS | 0 refills | Status: AC

## 2023-12-08 MED ORDER — MUPIROCIN 2 % EX OINT: 1.0000 | TOPICAL_OINTMENT | Freq: Two times a day (BID) | CUTANEOUS | 0 refills | Status: AC

## 2023-12-08 MED ORDER — HYDROXYZINE HCL 25 MG PO TABS: 25.0000 mg | ORAL_TABLET | Freq: Four times a day (QID) | ORAL | 0 refills | Status: AC | PRN

## 2023-12-08 MED ORDER — PREDNISONE 10 MG PO TABS: ORAL_TABLET | ORAL | 0 refills | Status: AC

## 2023-12-08 NOTE — Discharge Instructions (Signed)
 Use TecNu before going out in areas with known poison ivy/oak.  This will help prevent you from getting poison ivy/oak.  If you get a rash, you can use Zanfel or TecNu extreme to deactivate the oil, which will stop the rash from spreading and help with the itching.  Apply the Bactroban on scabbed areas to help prevent infection.  You can try treating the rash you have with triamcinolone to see if that helps.  If that does not work, then start the steroids.  If you were given steroids, make sure you finish all of them.  You may take Claritin,  Zyrtec  during the day, Atarax at night.  With the Claritin and Zyrtec  are not working, you can take the Atarax up to 4 times a day for itching as long as it does not make you too sleepy.  Dissolve 1 packet (or tablet) of Domeboro (aluminum acetate) in 1 pint of luke-warm water. Soak the affected areas with luke-warm Domeboro solution for 5-10 minutes twice daily. You may use apply gauze soaked in the domeboro.  Gently pat dry, Then apply the steriod / antibiotic cream. You may also take oatmeal baths with Aveeno oatmeal (1 cup in half full bathtub) or cornstarch/baking soda (1 cup each in half full bathtub). To prevent the oatmeal from caking in pipes, place it in a tied sock before dropping it into the bathtub.  Go to www.goodrx.com to look up your medications. This will give you a list of where you can find your prescriptions at the most affordable prices. Or ask the pharmacist what the cash price is, or if they have any other discount programs available to help make your medication more affordable. This can be less expensive than what you would pay with insurance.

## 2023-12-08 NOTE — ED Triage Notes (Signed)
 Pt c/o possible poison ivy across right arm and leg x3days  Pt states that she is having throat tightness   Pt states that she has a bad reaction with poison ivy and it gets in her blood and she has Wide spread rashes appear   Pt asks for a  benadryl shot and steroids

## 2023-12-08 NOTE — ED Provider Notes (Signed)
 HPI  SUBJECTIVE:  Monique [name] is a 53 y.o. female who presents with an erythematous, intensely pruritic rash on her right arm and leg after pulling up poison ivy 2 days ago.  It is starting to extend down her right arm.  She notes weeping, yellowish crusting and the lesion on her leg.  No aggravating or alleviating factors.  She has not tried anything for this.  She has a past medical history of severe poison ivy dermatitis x 2 even getting into my throat, abnormal uterine bleeding, asthma, plantar fasciitis, fibromyalgia, anxiety.  No history of MRSA.  LMP: 2.5 weeks ago.  Denies possibility being pregnant.  PCP: [facility]  Past Medical History:  Diagnosis Date   Asthma    Plantar fasciitis     Past Surgical History:  Procedure Laterality Date   CESAREAN SECTION     NOSE SURGERY     TONSILLECTOMY      Family History  Problem Relation Age of Onset   Cancer Mother    Diabetes Mother    Hypertension Mother    Heart failure Father    Diabetes Father    Hyperlipidemia Father     Social History   Tobacco Use   Smoking status: Never   Smokeless tobacco: Never  Vaping Use   Vaping status: Never Used  Substance Use Topics   Alcohol use: No   Drug use: No    No current facility-administered medications for this encounter.  Current Outpatient Medications:    hydrOXYzine (ATARAX) 25 MG tablet, Take 1 tablet (25 mg total) by mouth every 6 (six) hours as needed for up to 10 days for itching., Disp: 30 tablet, Rfl: 0   mupirocin ointment (BACTROBAN) 2 %, Apply 1 Application topically 2 (two) times daily., Disp: 22 g, Rfl: 0   predniSONE  (DELTASONE ) 10 MG tablet, Take 6 tabs by mouth daily  for 2 days, then 5 tabs for 2 days, then 4 tabs for 2 days, then 3 tabs for 2 days, 2 tabs for 2 days, then 1 tab by mouth daily for 2 days, Disp: 42 tablet, Rfl: 0   Tirzepatide-Weight Management (ZEPBOUND [place]), Inject into the skin., Disp: , Rfl:    triamcinolone ointment (KENALOG)  0.1 %, Apply 1 Application topically 2 (two) times daily., Disp: 30 g, Rfl: 0   ALPRAZolam (XANAX) 0.25 MG tablet, Take by mouth., Disp: , Rfl:    cetirizine  (ZYRTEC ) 10 MG tablet, Take 10 mg by mouth daily., Disp: , Rfl:    gabapentin (NEURONTIN) 300 MG capsule, Take by mouth., Disp: , Rfl:    naproxen sodium (ALEVE) 220 MG tablet, Take 220 mg by mouth., Disp: , Rfl:   Allergies  Allergen Reactions   Morphine Nausea Only    Other reaction(s): Other (See Comments)  Other Reaction: Intolerance  nausea  Other reaction(s): Other (See Comments)  Other Reaction: Intolerance  nausea     ROS  As noted in HPI.   Physical Exam  BP 117/69 (BP Location: Left Arm)   Pulse 70   Temp 98.1 F (36.7 C) (Oral)   Resp 16   Wt 92.9 kg   LMP 11/17/2023   SpO2 100%   BMI 37.46 kg/m   Constitutional: Well developed, well nourished, no acute distress Eyes:  EOMI, conjunctiva normal bilaterally HENT: Normocephalic, atraumatic,mucus membranes moist Respiratory: Normal inspiratory effort Cardiovascular: Normal rate GI: nondistended skin: Mildly tender vesicular papular rash on right upper arm   Scattered erythematous papules right  forearm   Erythematous vesicular rash on right lower extremity with yellowish crusting  Musculoskeletal: no deformities Neurologic: Alert & oriented x 3, no focal neuro deficits Psychiatric: Speech and behavior appropriate   ED Course   Medications - No data to display  No orders of the defined types were placed in this encounter.   No results found for this or any previous visit (from the past 24 hours). No results found.  ED Clinical Impression  1. Poison ivy dermatitis      ED Assessment/Plan     Patient consented to the use of photography for clinical documentation  Patient presents with a poison ivy dermatitis.  Will send home with Bactroban to help event secondary infection, triamcinolone ointment, Claritin or Zyrtec , and if that  does not work, then Atarax.  Advised scrubbing everything that came in contact with this with take new example.  Oatmeal baths.  Will send home with a prednisone  12-day taper since she reports 2 episodes of severe poison ivy dermatitis previously, even going into her throat.  Follow-up with PCP as needed.  To the ER for any signs of airway compromise, shortness of breath.  Discussed  MDM, treatment plan, and plan for follow-up with patient. Discussed sn/sx that should prompt return to the ED. patient agrees with plan.   Meds ordered this encounter  Medications   hydrOXYzine (ATARAX) 25 MG tablet    Sig: Take 1 tablet (25 mg total) by mouth every 6 (six) hours as needed for up to 10 days for itching.    Dispense:  30 tablet    Refill:  0   predniSONE  (DELTASONE ) 10 MG tablet    Sig: Take 6 tabs by mouth daily  for 2 days, then 5 tabs for 2 days, then 4 tabs for 2 days, then 3 tabs for 2 days, 2 tabs for 2 days, then 1 tab by mouth daily for 2 days    Dispense:  42 tablet    Refill:  0   mupirocin ointment (BACTROBAN) 2 %    Sig: Apply 1 Application topically 2 (two) times daily.    Dispense:  22 g    Refill:  0   triamcinolone ointment (KENALOG) 0.1 %    Sig: Apply 1 Application topically 2 (two) times daily.    Dispense:  30 g    Refill:  0      *This clinic note was created using Scientist, clinical (histocompatibility and immunogenetics). Therefore, there may be occasional mistakes despite careful proofreading.  ?    Van Knee, MD 12/08/23 [id number]
